# Patient Record
Sex: Male | Born: 1980 | Race: White | Hispanic: No | Smoking: Current every day smoker
Health system: Southern US, Community
[De-identification: ages and names within clinical notes are randomized; demographics above are authoritative.]

## PROBLEM LIST (undated history)

## (undated) DIAGNOSIS — K219 Gastro-esophageal reflux disease without esophagitis: Secondary | ICD-10-CM

## (undated) DIAGNOSIS — I10 Essential (primary) hypertension: Secondary | ICD-10-CM

## (undated) DIAGNOSIS — K311 Adult hypertrophic pyloric stenosis: Secondary | ICD-10-CM

## (undated) DIAGNOSIS — F419 Anxiety disorder, unspecified: Secondary | ICD-10-CM

## (undated) HISTORY — PX: OTHER SURGICAL HISTORY: SHX169

---

## 2008-07-13 ENCOUNTER — Emergency Department: Payer: Self-pay | Admitting: Emergency Medicine

## 2011-04-09 ENCOUNTER — Observation Stay: Payer: Self-pay | Admitting: Student

## 2015-11-13 ENCOUNTER — Encounter: Payer: Self-pay | Admitting: Emergency Medicine

## 2015-11-13 ENCOUNTER — Emergency Department
Admission: EM | Admit: 2015-11-13 | Discharge: 2015-11-13 | Disposition: A | Discharge status: Home / Self Care | Payer: Self-pay | Attending: Emergency Medicine | Admitting: Emergency Medicine

## 2015-11-13 DIAGNOSIS — F17210 Nicotine dependence, cigarettes, uncomplicated: Secondary | ICD-10-CM | POA: Insufficient documentation

## 2015-11-13 DIAGNOSIS — F1499 Cocaine use, unspecified with unspecified cocaine-induced disorder: Secondary | ICD-10-CM | POA: Insufficient documentation

## 2015-11-13 DIAGNOSIS — F1490 Cocaine use, unspecified, uncomplicated: Secondary | ICD-10-CM

## 2015-11-13 DIAGNOSIS — Z789 Other specified health status: Secondary | ICD-10-CM

## 2015-11-13 DIAGNOSIS — Z7289 Other problems related to lifestyle: Secondary | ICD-10-CM

## 2015-11-13 DIAGNOSIS — K920 Hematemesis: Secondary | ICD-10-CM | POA: Insufficient documentation

## 2015-11-13 DIAGNOSIS — F1099 Alcohol use, unspecified with unspecified alcohol-induced disorder: Secondary | ICD-10-CM | POA: Insufficient documentation

## 2015-11-13 LAB — CBC
HCT: 46.7 % (ref 40.0–52.0)
HEMOGLOBIN: 15.7 g/dL (ref 13.0–18.0)
MCH: 29.5 pg (ref 26.0–34.0)
MCHC: 33.7 g/dL (ref 32.0–36.0)
MCV: 87.6 fL (ref 80.0–100.0)
Platelets: 184 10*3/uL (ref 150–440)
RBC: 5.33 MIL/uL (ref 4.40–5.90)
RDW: 13.2 % (ref 11.5–14.5)
WBC: 9 10*3/uL (ref 3.8–10.6)

## 2015-11-13 LAB — COMPREHENSIVE METABOLIC PANEL
ALBUMIN: 4.6 g/dL (ref 3.5–5.0)
ALT: 29 U/L (ref 17–63)
ANION GAP: 9 (ref 5–15)
AST: 37 U/L (ref 15–41)
Alkaline Phosphatase: 53 U/L (ref 38–126)
BUN: 17 mg/dL (ref 6–20)
CO2: 28 mmol/L (ref 22–32)
Calcium: 10.3 mg/dL (ref 8.9–10.3)
Chloride: 101 mmol/L (ref 101–111)
Creatinine, Ser: 1.06 mg/dL (ref 0.61–1.24)
GFR calc Af Amer: >60 mL/min (ref >60)
GFR calc non Af Amer: >60 mL/min (ref >60)
GLUCOSE: 129 mg/dL — AB (ref 65–99)
POTASSIUM: 3.2 mmol/L — AB (ref 3.5–5.1)
SODIUM: 138 mmol/L (ref 135–145)
Total Bilirubin: 1 mg/dL (ref 0.3–1.2)
Total Protein: 7.6 g/dL (ref 6.5–8.1)

## 2015-11-13 LAB — LIPASE, BLOOD: Lipase: 18 U/L (ref 11–51)

## 2015-11-13 NOTE — ED Notes (Addendum)
Pt to ed with c/o vomiting since yesterday.  Pt reports it started out as clear liquid and then it turned to brown.  Pt states there was only one episode of vomiting.  Denies diarrhea. Pt states he ate a cheeseburger about 1 hour pta.  Denies vomiting since eating.

## 2015-11-13 NOTE — ED Provider Notes (Signed)
[facility] Emergency Department Provider Note  ____________________________________________  Time seen: Approximately 3:56 PM  I have reviewed the triage vital signs and the nursing notes.   HISTORY  Chief Complaint Hematemesis    HPI Jermaine Alvarez is a 35 y.o. male who is a daily alcohol drinker, cocaine user, presenting with hematemesis. The patient reports that he injected crack last night in 2 hours later he became nauseated and had 7 or 8 violent episodes of vomiting. The last several episodes had bright red blood in the vomit.He has had no further episodes of vomiting, denies any abdominal pain, lightheadedness, shortness of breath, or syncope. No black or tarry-colored looking stools. He has been eating normally today, including a cheeseburger at lunch.   History reviewed. No pertinent past medical history.  There are no active problems to display for this patient.   History reviewed. No pertinent past surgical history.  No current outpatient prescriptions on file.  Allergies Review of patient's allergies indicates no known allergies.  History reviewed. No pertinent family history.  Social History Social History  Substance Use Topics  . Smoking status: Current Every Day Smoker    Types: Cigarettes  . Smokeless tobacco: None  . Alcohol Use: 2.4 oz/week    4 Cans of beer per week    Review of Systems Constitutional: No fever/chills.No lightheadedness or syncope. Eyes: No visual changes. ENT: No sore throat. No congestion or rhinorrhea. Cardiovascular: Denies chest pain. Denies palpitations. Respiratory: Denies shortness of breath.  No cough. Gastrointestinal: No abdominal pain.  Positive nausea and vomiting with hematemesis.  No diarrhea.  No constipation. No melena or bright red blood per rectum. Genitourinary: Negative for dysuria. Musculoskeletal: Negative for back pain. Skin: Negative for rash. Neurological: Negative for  headaches. No focal numbness, tingling or weakness.   10-point ROS otherwise negative.  ____________________________________________   PHYSICAL EXAM:  VITAL SIGNS: ED Triage Vitals  Enc Vitals Group     BP 11/13/15 1256 159/89 mmHg     Pulse Rate 11/13/15 1256 68     Resp 11/13/15 1256 14     Temp 11/13/15 1256 98.4 F (36.9 C)     Temp Source 11/13/15 1256 Oral     SpO2 11/13/15 1256 100 %     Weight 11/13/15 1256 150 lb (68.04 kg)     Height 11/13/15 1256  (1.753 m)     Head Cir --      Peak Flow --      Pain Score 11/13/15 1259 0     Pain Loc --      Pain Edu? --      Excl. in GC? --     Constitutional: Alert and oriented. Well appearing and in no acute distress. Answers questions appropriately. Eyes: Conjunctivae are normal.  EOMI. No scleral icterus.No conjunctival pallor. Head: Atraumatic. Nose: No congestion/rhinnorhea. Mouth/Throat: Mucous membranes are moist.  Neck: No stridor.  Supple.  No JVD. No meningismus. Cardiovascular: Normal rate, regular rhythm. No murmurs, rubs or gallops.  Respiratory: Normal respiratory effort.  No accessory muscle use or retractions. Lungs CTAB.  No wheezes, rales or ronchi. GU: pt deferred Gastrointestinal: Soft, nontender and nondistended.  No guarding or rebound.  No peritoneal signs. Musculoskeletal: No LE edema. No ttp in the calves or palpable cords.  Negative Homan's sign. Neurologic:  A&Ox3.  Speech is clear.  Face and smile are symmetric.  EOMI.  Moves all extremities well. Skin:  Skin is warm, dry and intact. Multiple  track marks on his arms. No pallor. Psychiatric: Mood and affect are normal. Speech and behavior are normal.  Normal judgement.  ____________________________________________   LABS (all labs ordered are listed, but only abnormal results are displayed)  Labs Reviewed  COMPREHENSIVE METABOLIC PANEL - Abnormal; Notable for the following:    Potassium 3.2 (*)    Glucose, Bld 129 (*)    All other  components within normal limits  LIPASE, BLOOD  CBC  URINALYSIS COMPLETEWITH MICROSCOPIC ([facility] ONLY)   ____________________________________________  EKG  Not indicated ____________________________________________  RADIOLOGY  No results found.  ____________________________________________   PROCEDURES  Procedure(s) performed: None  Critical Care performed: No ____________________________________________   INITIAL IMPRESSION / ASSESSMENT AND PLAN / ED COURSE  Pertinent labs & imaging results that were available during my care of the patient were reviewed by me and considered in my medical decision making (see chart for details).  35 y.o. male with daily alcohol abuse and crack cocaine use presenting with multiple episodes of hematemesis last night, now resolved. The patient has not had any black or tarry-colored stools, and has no symptoms that would be concerning for significant anemia or dehydration or hypovolemia. The patient has stable vital signs today, and a normal H/H.  He has no epigastric pain, and I do not palpate anything in his abdomen that makes me concerned for acute intra-abdominal pathology. He has not been having pain, so GERD or peptic ulcer is less likely. It is possible that his hematemesis comes from esophageal irritation or gastric lining irritation with forceful vomiting. He does not have any clinical symptoms that would be concerning for esophageal tear. We have had a long discussion about NG tube and rectal examination for confirmation, and the patient defers this testing at this time. He understands that if he is bleeding, that the skin become a life-threatening condition and a very short period of time. He understands that we cannot say for certain that he is not bleeding if we do not do this testing. He understands return precautions and follow-up instructions, and prefers to be discharged home.  ____________________________________________  FINAL  CLINICAL IMPRESSION(S) / ED DIAGNOSES  Final diagnoses:  Alcohol use (HCC)  Hematemesis with nausea  Crack cocaine use      NEW MEDICATIONS STARTED DURING THIS VISIT:  New Prescriptions   No medications on file     Rockne MenghiniAnne-Caroline Jaycee Pelzer, MD 11/13/15 1603

## 2015-11-13 NOTE — Discharge Instructions (Signed)
Please stop using crack and cocaine, and either stop drinking or drink in moderation. If you are daily drinker, please talk to her primary care physician about stopping your alcohol use and do not quit cold [place].  Please return to the emergency department if you develop bleeding, lightheadedness or fainting, shortness of breath, black or tarry-colored looking stools, abdominal pain, or any other symptoms concerning to you.

## 2016-05-23 ENCOUNTER — Other Ambulatory Visit: Payer: Self-pay | Admitting: Nurse Practitioner

## 2016-05-23 DIAGNOSIS — K219 Gastro-esophageal reflux disease without esophagitis: Secondary | ICD-10-CM

## 2016-05-23 DIAGNOSIS — B1710 Acute hepatitis C without hepatic coma: Secondary | ICD-10-CM

## 2016-06-04 ENCOUNTER — Inpatient Hospital Stay: Admission: RE | Admit: 2016-06-04 | Payer: Self-pay | Source: Ambulatory Visit

## 2016-06-04 ENCOUNTER — Ambulatory Visit: Admission: RE | Admit: 2016-06-04 | Payer: Self-pay | Source: Ambulatory Visit

## 2016-06-17 ENCOUNTER — Ambulatory Visit
Admission: RE | Admit: 2016-06-17 | Discharge: 2016-06-17 | Disposition: A | Discharge status: Home / Self Care | Payer: Commercial Managed Care - PPO | Source: Ambulatory Visit | Attending: Nurse Practitioner | Admitting: Nurse Practitioner

## 2016-06-17 DIAGNOSIS — K449 Diaphragmatic hernia without obstruction or gangrene: Secondary | ICD-10-CM | POA: Insufficient documentation

## 2016-06-17 DIAGNOSIS — K219 Gastro-esophageal reflux disease without esophagitis: Secondary | ICD-10-CM | POA: Diagnosis not present

## 2016-06-17 DIAGNOSIS — B1710 Acute hepatitis C without hepatic coma: Secondary | ICD-10-CM

## 2017-08-27 ENCOUNTER — Other Ambulatory Visit: Payer: Self-pay | Admitting: Family Medicine

## 2017-08-27 DIAGNOSIS — E049 Nontoxic goiter, unspecified: Secondary | ICD-10-CM

## 2017-09-10 ENCOUNTER — Ambulatory Visit
Admission: RE | Admit: 2017-09-10 | Discharge: 2017-09-10 | Disposition: A | Discharge status: Home / Self Care | Payer: Commercial Managed Care - PPO | Source: Ambulatory Visit | Attending: Family Medicine | Admitting: Family Medicine

## 2017-09-10 DIAGNOSIS — E049 Nontoxic goiter, unspecified: Secondary | ICD-10-CM | POA: Diagnosis not present

## 2018-04-13 ENCOUNTER — Ambulatory Visit
Admission: EM | Admit: 2018-04-13 | Discharge: 2018-04-13 | Disposition: A | Discharge status: Home / Self Care | Payer: Commercial Managed Care - PPO | Attending: Family Medicine | Admitting: Family Medicine

## 2018-04-13 ENCOUNTER — Other Ambulatory Visit: Payer: Self-pay

## 2018-04-13 ENCOUNTER — Encounter: Payer: Self-pay | Admitting: Emergency Medicine

## 2018-04-13 DIAGNOSIS — B9789 Other viral agents as the cause of diseases classified elsewhere: Secondary | ICD-10-CM

## 2018-04-13 DIAGNOSIS — J0100 Acute maxillary sinusitis, unspecified: Secondary | ICD-10-CM | POA: Diagnosis not present

## 2018-04-13 DIAGNOSIS — J069 Acute upper respiratory infection, unspecified: Secondary | ICD-10-CM

## 2018-04-13 HISTORY — DX: Anxiety disorder, unspecified: F41.9

## 2018-04-13 HISTORY — DX: Essential (primary) hypertension: I10

## 2018-04-13 LAB — RAPID INFLUENZA A&B ANTIGENS (ARMC ONLY): INFLUENZA B (ARMC): NEGATIVE

## 2018-04-13 LAB — RAPID INFLUENZA A&B ANTIGENS: Influenza A (ARMC): NEGATIVE

## 2018-04-13 MED ORDER — DOXYCYCLINE HYCLATE 100 MG PO CAPS: 100.0000 mg | ORAL_CAPSULE | Freq: Two times a day (BID) | ORAL | 0 refills | Status: DC

## 2018-04-13 MED ORDER — BENZONATATE 100 MG PO CAPS: 100.0000 mg | ORAL_CAPSULE | Freq: Three times a day (TID) | ORAL | 0 refills | Status: DC | PRN

## 2018-04-13 NOTE — ED Provider Notes (Signed)
[facility] ____________________________________________  Time seen: Approximately 3:23 PM  I have reviewed the triage vital signs and the nursing notes.   HISTORY  Chief Complaint Cough  HPI Jermaine Alvarez is a 37 y.o. male presented for evaluation of 1 week of runny nose, nasal congestion and cough.  Reports some intermittent sore throat.  States symptoms started off like a cold, but have continued.  Denies fevers at first.  Reports for the last 2 days he has been having intermittent low-grade fevers, T-max 99.1.  Did take some over-the-counter ibuprofen, last taken last night.  Occasional Mucinex use.  Denies other over-the-counter medications use for the same complaints.  Has continued to remain active.  Continues to overall eat and drink well.  States cough is day and nighttime, sometimes dry, sometimes productive of greenish-yellowish mucus.  Also thick yellowish mucus with blowing nose.  Intermittent sinus pressure on his face described as mild. Denies chest pain, shortness of breath, abdominal pain, or rash. Denies recent sickness. Denies recent antibiotic use.  Denies known direct sick contacts.  Jerl Mina, MD: PCP   Past Medical History:  Diagnosis Date  . Anxiety   . Hypertension     There are no active problems to display for this patient.   History reviewed. No pertinent surgical history.   No current facility-administered medications for this encounter.   Current Outpatient Medications:  .  escitalopram (LEXAPRO) 10 MG tablet, Take 10 mg by mouth daily., Disp: , Rfl: 2 .  lisinopril (PRINIVIL,ZESTRIL) 20 MG tablet, Take by mouth., Disp: , Rfl:  .  omeprazole (PRILOSEC) 20 MG capsule, Take by mouth., Disp: , Rfl:  .  benzonatate (TESSALON PERLES) 100 MG capsule, Take 1 capsule (100 mg total) by mouth 3 (three) times daily as needed for cough., Disp: 15 capsule, Rfl: 0 .  doxycycline (VIBRAMYCIN) 100 MG capsule, Take 1 capsule (100 mg total) by  mouth 2 (two) times daily., Disp: 20 capsule, Rfl: 0  Allergies Patient has no known allergies.  History reviewed. No pertinent family history.  Social History Social History   Tobacco Use  . Smoking status: Current Every Day Smoker    Packs/day: 1.00    Types: Cigarettes  . Smokeless tobacco: Never Used  Substance Use Topics  . Alcohol use: Yes    Alcohol/week: 4.0 standard drinks    Types: 4 Cans of beer per week    Comment: 1-2 beers daily  . Drug use: Not Currently    Types: Cocaine, Marijuana    Comment: pain pills, "whatever I can get"      Review of Systems Constitutional: possible fevers.  ENT: as above.  Cardiovascular: Denies chest pain. Respiratory: Denies shortness of breath. Gastrointestinal: No abdominal pain.  No nausea, no vomiting.  No diarrhea.   Genitourinary: Negative for dysuria. Musculoskeletal: Negative for back pain. Skin: Negative for rash.  ____________________________________________   PHYSICAL EXAM:  VITAL SIGNS: ED Triage Vitals  Enc Vitals Group     BP 04/13/18 1503 (!) 142/86     Pulse Rate 04/13/18 1503 (!) 102     Resp 04/13/18 1503 17     Temp 04/13/18 1503 99 F (37.2 C)     Temp Source 04/13/18 1503 Oral     SpO2 04/13/18 1503 99 %     Weight 04/13/18 1504 155 lb (70.3 kg)     Height 04/13/18 1504 5\' 10"  (1.778 m)     Head Circumference --      Peak  Flow --      Pain Score 04/13/18 1504 2     Pain Loc --      Pain Edu? --      Excl. in GC? --    Constitutional: Alert and oriented. Well appearing and in no acute distress. Eyes: Conjunctivae are normal.  Head: Atraumatic.Minimal tenderness to palpation bilateral maxillary sinuses.No frontal sinus tenderness.  No swelling. No erythema.   Ears: no erythema, normal TMs bilaterally.   Nose: nasal congestion with bilateral nasal turbinate erythema and edema.   Mouth/Throat: Mucous membranes are moist.  Oropharynx non-erythematous.No tonsillar swelling or exudate.  Neck: No  stridor.  No cervical spine tenderness to palpation. Hematological/Lymphatic/Immunilogical: No cervical lymphadenopathy. Cardiovascular: Normal rate, regular rhythm. Grossly normal heart sounds.  Good peripheral circulation. Respiratory: Normal respiratory effort.  No retractions.No wheezes, rales or rhonchi. Good air movement.  Dry intermittent cough in room. Musculoskeletal: No cervical, thoracic or lumbar tenderness to palpation.  Neurologic:  Normal speech and language.No gait instability. Skin:  Skin is warm, dry and intact. No rash noted. Psychiatric: Mood and affect are normal. Speech and behavior are normal.  ___________________________________________   LABS (all labs ordered are listed, but only abnormal results are displayed)  Labs Reviewed  RAPID INFLUENZA A&B ANTIGENS ([facility] ONLY)     PROCEDURES Procedures   INITIAL IMPRESSION / ASSESSMENT AND PLAN / ED COURSE  Pertinent labs & imaging results that were available during my care of the patient were reviewed by me and considered in my medical decision making (see chart for details).  Well-appearing patient.  No acute distress.  Suspect recent viral upper respiratory infection, with secondary sinusitis and bronchitis.  Will treat with oral doxycycline, PRN Tessalon Perles and discussed continue over-the-counter Mucinex.  Encourage rest, fluids, supportive care and PRN ibuprofen and Tylenol as needed for fever control.  Work note given for today and tomorrow.Discussed indication, risks and benefits of medications with patient.  Discussed follow up with Primary care physician this week. Discussed follow up and return parameters including no resolution or any worsening concerns. Patient verbalized understanding and agreed to plan.   ____________________________________________   FINAL CLINICAL IMPRESSION(S) / ED DIAGNOSES  Final diagnoses:  Viral URI with cough  Acute maxillary sinusitis, recurrence not specified      ED Discharge Orders         Ordered    doxycycline (VIBRAMYCIN) 100 MG capsule  2 times daily     04/13/18 1518    benzonatate (TESSALON PERLES) 100 MG capsule  3 times daily PRN     04/13/18 1518           Note: This dictation was prepared with Dragon dictation along with smaller phrase technology. Any transcriptional errors that result from this process are unintentional.         Renford Dills, NP 04/13/18 1530

## 2018-04-13 NOTE — Discharge Instructions (Addendum)
Take medication as prescribed. Rest. Drink plenty of fluids.  ° °Follow up with your primary care physician this week as needed. Return to Urgent care for new or worsening concerns.  ° °

## 2018-04-13 NOTE — ED Triage Notes (Signed)
Pt c/o cough, congestion, and low grade fever. He had congestion for about a week but worsened yesterday.

## 2019-12-01 ENCOUNTER — Emergency Department
Admission: EM | Admit: 2019-12-01 | Discharge: 2019-12-01 | Disposition: A | Discharge status: Home / Self Care | Payer: Self-pay | Attending: Student | Admitting: Student

## 2019-12-01 ENCOUNTER — Encounter: Payer: Self-pay | Admitting: Emergency Medicine

## 2019-12-01 ENCOUNTER — Other Ambulatory Visit: Payer: Self-pay

## 2019-12-01 ENCOUNTER — Emergency Department: Payer: Self-pay

## 2019-12-01 DIAGNOSIS — M545 Low back pain, unspecified: Secondary | ICD-10-CM

## 2019-12-01 DIAGNOSIS — I10 Essential (primary) hypertension: Secondary | ICD-10-CM | POA: Insufficient documentation

## 2019-12-01 DIAGNOSIS — Z79899 Other long term (current) drug therapy: Secondary | ICD-10-CM | POA: Insufficient documentation

## 2019-12-01 DIAGNOSIS — F17210 Nicotine dependence, cigarettes, uncomplicated: Secondary | ICD-10-CM | POA: Insufficient documentation

## 2019-12-01 LAB — URINALYSIS, COMPLETE (UACMP) WITH MICROSCOPIC
Bacteria, UA: NONE SEEN
Bilirubin Urine: NEGATIVE
Glucose, UA: NEGATIVE mg/dL
Ketones, ur: NEGATIVE mg/dL
Leukocytes,Ua: NEGATIVE
Nitrite: NEGATIVE
Protein, ur: NEGATIVE mg/dL
Specific Gravity, Urine: 1.016 (ref 1.005–1.030)
Squamous Epithelial / LPF: NONE SEEN (ref 0–5)
pH: 8 (ref 5.0–8.0)

## 2019-12-01 MED ORDER — NAPROXEN 500 MG PO TABS: 500.0000 mg | ORAL_TABLET | Freq: Two times a day (BID) | ORAL | 0 refills | Status: DC

## 2019-12-01 MED ORDER — KETOROLAC TROMETHAMINE 30 MG/ML IJ SOLN
30.0000 mg | Freq: Once | INTRAMUSCULAR | Status: AC
  Administered 2019-12-01: 30 mg via INTRAMUSCULAR
  Filled 2019-12-01: qty 1

## 2019-12-01 MED ORDER — CYCLOBENZAPRINE HCL 5 MG PO TABS: 5.0000 mg | ORAL_TABLET | Freq: Every day | ORAL | 0 refills | Status: DC

## 2019-12-01 NOTE — ED Provider Notes (Signed)
[facility] Emergency Department Provider Note   ____________________________________________   First MD Initiated Contact with Patient 12/01/19 1222     (approximate)  I have reviewed the triage vital signs and the nursing notes.   HISTORY  Chief Complaint Back Pain   HPI Jermaine Alvarez is a 39 y.o. male presents to the ED with complaint of mid back pain.  Patient states that this is worse with movement and started on his right side and has moved to his left.  He states that last evening when he was getting in bed he had a difficult time getting comfortable.  He began having back pain "years ago" and has gradually gotten worse over the years until the last month it has become worse.  Patient denies any nausea, vomiting, urinary symptoms or history of kidney stones.  Patient denies any known injury to his back.  He has taken some over-the-counter medication without any relief of his symptoms.  He denies any paresthesias or incontinence of bowel or bladder.  Patient still able to ambulate without assistance.  He rates pain as 7 out of 10.       Past Medical History:  Diagnosis Date  . Anxiety   . Hypertension     There are no problems to display for this patient.   History reviewed. No pertinent surgical history.  Prior to Admission medications   Medication Sig Start Date End Date Taking? Authorizing Provider  cyclobenzaprine (FLEXERIL) 5 MG tablet Take 1 tablet (5 mg total) by mouth at bedtime. 12/01/19   Tommi Rumps, PA-C  escitalopram (LEXAPRO) 10 MG tablet Take 10 mg by mouth daily. 03/21/18   [provider]  lisinopril (PRINIVIL,ZESTRIL) 20 MG tablet Take by mouth. 11/06/17   [provider]  naproxen (NAPROSYN) 500 MG tablet Take 1 tablet (500 mg total) by mouth 2 (two) times daily with a meal. 12/01/19   Tommi Rumps, PA-C  omeprazole (PRILOSEC) 20 MG capsule Take by mouth. 08/01/16   [provider]     Allergies Patient has no known allergies.  History reviewed. No pertinent family history.  Social History Social History   Tobacco Use  . Smoking status: Current Every Day Smoker    Packs/day: 1.00    Types: Cigarettes  . Smokeless tobacco: Never Used  Vaping Use  . Vaping Use: Never used  Substance Use Topics  . Alcohol use: Yes    Alcohol/week: 4.0 standard drinks    Types: 4 Cans of beer per week    Comment: 1-2 beers daily  . Drug use: Not Currently    Types: Cocaine, Marijuana    Comment: pain pills, "whatever I can get"      Review of Systems Constitutional: No fever/chills Eyes: No visual changes. Cardiovascular: Denies chest pain. Respiratory: Denies shortness of breath. Gastrointestinal: No abdominal pain.  No nausea, no vomiting.  Genitourinary: Negative for dysuria. Musculoskeletal: Positive for back pain. Skin: Negative for rash. Neurological: Negative for headaches, focal weakness or numbness. ____________________________________________   PHYSICAL EXAM:  VITAL SIGNS: ED Triage Vitals [12/01/19 1155]  Enc Vitals Group     BP (!) 140/95     Pulse Rate 96     Resp 20     Temp 98.2 F (36.8 C)     Temp Source Oral     SpO2 98 %     Weight 180 lb (81.6 kg)     Height 5\' 9"  (1.753 m)  Head Circumference      Peak Flow      Pain Score 7     Pain Loc      Pain Edu?      Excl. in [place]?     Constitutional: Alert and oriented. Well appearing and in no acute distress. Eyes: Conjunctivae are normal.  Head: Atraumatic. Neck: No stridor.   Cardiovascular: Normal rate, regular rhythm. Grossly normal heart sounds.  Good peripheral circulation. Respiratory: Normal respiratory effort.  No retractions. Lungs CTAB. Gastrointestinal: Soft and nontender. No distention.  Bowel sounds are normoactive x4 quadrants. Musculoskeletal: There is some mild tenderness on palpation of the lumbar spine and paravertebral muscles bilaterally.  There is also noted  tenderness to bilateral ribs to palpation laterally which increases with range of motion.  Patient is restricted with range of motion secondary to pain.  Good muscle strength bilaterally.   Neurologic:  Normal speech and language.  Reflexes are equal bilaterally.  No gross focal neurologic deficits are appreciated.  Skin:  Skin is warm, dry and intact. No rash noted. Psychiatric: Mood and affect are normal. Speech and behavior are normal.  ____________________________________________   LABS (all labs ordered are listed, but only abnormal results are displayed)  Labs Reviewed  URINALYSIS, COMPLETE (UACMP) WITH MICROSCOPIC - Abnormal; Notable for the following components:      Result Value   Color, Urine YELLOW (*)    APPearance CLEAR (*)    Hgb urine dipstick SMALL (*)    All other components within normal limits   RADIOLOGY   Official radiology report(s): DG Thoracic Spine 2 View  Result Date: 12/01/2019 CLINICAL DATA:  Mid to upper right-sided back pain. EXAM: THORACIC SPINE 2 VIEWS COMPARISON:  None. FINDINGS: There is no evidence of thoracic spine fracture. Alignment is normal. No other significant bone abnormalities are identified. IMPRESSION: Negative. Electronically Signed   By: Virgina Norfolk M.D.   On: 12/01/2019 15:38   DG Lumbar Spine 2-3 Views  Result Date: 12/01/2019 CLINICAL DATA:  39 year old male with back pain.  No known injury. EXAM: LUMBAR SPINE - 2-3 VIEW COMPARISON:  None. FINDINGS: Five lumbar type vertebra. There is no acute fracture or subluxation of the lumbar spine. The vertebral body heights and disc spaces are maintained. The visualized posterior elements appear intact. The neural foramina are patent. The soft tissues are unremarkable. IMPRESSION: Negative. Electronically Signed   By: Anner Crete M.D.   On: 12/01/2019 15:37    ____________________________________________   PROCEDURES  Procedure(s) performed (including Critical  Care):  Procedures   ____________________________________________   INITIAL IMPRESSION / ASSESSMENT AND PLAN / ED COURSE  As part of my medical decision making, I reviewed the following data within the electronic MEDICAL RECORD NUMBER Notes from prior ED visits and [place] Controlled Substance Database  39 year old male presents to the ED with complaint of back pain without history of injury.  Patient states that pain started today.  Patient states that it began on the right side and moved over to the left side making it difficult for him to move.  Movement increases his pain.  He denies any urinary symptoms or history of stones.  Urinalysis was negative and lumbar spine was negative and reassuring for the patient.  Patient states that he was getting relief from his pain after being given Toradol 30 mg IM prior to his x-rays.  A prescription for Flexeril 5 mg 1 at bedtime for the next 5 days and Naprosyn 500 mg twice daily with  food.  Patient is to follow-up with his PCP if any continued problems.  He is aware that he can use ice or heat to his back as needed for discomfort.  ____________________________________________   FINAL CLINICAL IMPRESSION(S) / ED DIAGNOSES  Final diagnoses:  Back pain, lumbosacral     ED Discharge Orders         Ordered    cyclobenzaprine (FLEXERIL) 5 MG tablet  Daily at bedtime     Discontinue  Reprint     12/01/19 1553    naproxen (NAPROSYN) 500 MG tablet  2 times daily with meals     Discontinue  Reprint     12/01/19 1553           Note:  This document was prepared using Dragon voice recognition software and may include unintentional dictation errors.    Tommi Rumps, PA-C 12/01/19 1603    Miguel Aschoff., MD 12/02/19 2014

## 2019-12-01 NOTE — ED Triage Notes (Signed)
Pt presents to ED via POV with c/o mid back pain at this time. Pt states pain worse with movement. Pt states pain started on R side and moved to L side. Pt states pain worse with movement/turning. Pt also c/o nasal congestion and cough at this time, pt states has been going on for months.

## 2019-12-01 NOTE — Discharge Instructions (Signed)
Follow-up with your primary care provider if any continued problems or concerns.  You may use ice or heat to your back as needed for discomfort.  Begin taking medication with Flexeril being at bedtime only.  Do not drive while taking this medication as it could cause drowsiness and increase your risk for injury.  The naproxen is with food twice a day and will help with inflammation and pain.  X-rays and urinalysis were negative which is reassuring.

## 2020-04-03 ENCOUNTER — Other Ambulatory Visit: Payer: Self-pay

## 2020-04-03 ENCOUNTER — Encounter: Payer: Self-pay | Admitting: Internal Medicine

## 2020-04-03 ENCOUNTER — Ambulatory Visit (INDEPENDENT_AMBULATORY_CARE_PROVIDER_SITE_OTHER): Payer: Self-pay

## 2020-04-03 ENCOUNTER — Ambulatory Visit
Admission: EM | Admit: 2020-04-03 | Discharge: 2020-04-03 | Disposition: A | Discharge status: Home / Self Care | Payer: Self-pay | Attending: Family Medicine | Admitting: Family Medicine

## 2020-04-03 DIAGNOSIS — Y929 Unspecified place or not applicable: Secondary | ICD-10-CM | POA: Insufficient documentation

## 2020-04-03 DIAGNOSIS — Y939 Activity, unspecified: Secondary | ICD-10-CM | POA: Insufficient documentation

## 2020-04-03 DIAGNOSIS — R109 Unspecified abdominal pain: Secondary | ICD-10-CM | POA: Insufficient documentation

## 2020-04-03 DIAGNOSIS — F17210 Nicotine dependence, cigarettes, uncomplicated: Secondary | ICD-10-CM | POA: Insufficient documentation

## 2020-04-03 DIAGNOSIS — Z20822 Contact with and (suspected) exposure to covid-19: Secondary | ICD-10-CM | POA: Insufficient documentation

## 2020-04-03 DIAGNOSIS — J069 Acute upper respiratory infection, unspecified: Secondary | ICD-10-CM

## 2020-04-03 DIAGNOSIS — R3129 Other microscopic hematuria: Secondary | ICD-10-CM

## 2020-04-03 DIAGNOSIS — S2249XA Multiple fractures of ribs, unspecified side, initial encounter for closed fracture: Secondary | ICD-10-CM

## 2020-04-03 DIAGNOSIS — R079 Chest pain, unspecified: Secondary | ICD-10-CM

## 2020-04-03 DIAGNOSIS — M545 Low back pain, unspecified: Secondary | ICD-10-CM | POA: Insufficient documentation

## 2020-04-03 DIAGNOSIS — Y999 Unspecified external cause status: Secondary | ICD-10-CM | POA: Insufficient documentation

## 2020-04-03 DIAGNOSIS — R059 Cough, unspecified: Secondary | ICD-10-CM | POA: Insufficient documentation

## 2020-04-03 DIAGNOSIS — R0981 Nasal congestion: Secondary | ICD-10-CM | POA: Insufficient documentation

## 2020-04-03 HISTORY — DX: Adult hypertrophic pyloric stenosis: K31.1

## 2020-04-03 LAB — URINALYSIS, COMPLETE (UACMP) WITH MICROSCOPIC
Bilirubin Urine: NEGATIVE
Glucose, UA: NEGATIVE mg/dL
Leukocytes,Ua: NEGATIVE
Nitrite: NEGATIVE
Specific Gravity, Urine: 1.02 (ref 1.005–1.030)
pH: 7 (ref 5.0–8.0)

## 2020-04-03 LAB — SARS CORONAVIRUS 2 (TAT 6-24 HRS): SARS Coronavirus 2: NEGATIVE

## 2020-04-03 MED ORDER — LIDOCAINE 5 % EX PTCH: 1.0000 | MEDICATED_PATCH | CUTANEOUS | 0 refills | Status: DC

## 2020-04-03 MED ORDER — TRAMADOL HCL 50 MG PO TABS: 50.0000 mg | ORAL_TABLET | Freq: Four times a day (QID) | ORAL | 0 refills | Status: DC | PRN

## 2020-04-03 NOTE — ED Provider Notes (Signed)
[facility]    CSN: [id number] Arrival date & time: 04/03/20  1215      History   Chief Complaint Chief Complaint  Patient presents with  . Flank Pain  . Cough  . Nasal Congestion    HPI Jermaine Alvarez is a 39 y.o. male who presents with 2 complaints 1- R flank pain which started 3 days ago and radiates to his abdomen. Also been having urinary frequency. He has never had renal stones. His mother has had them. Coughing provokes his L back pain. He called and mentioned to the xray tech that he reached over his truck window 10 days ago and hit his R rib area, but was not in severe pain.   2- Has had nose and chest congestion x 3 days. Has felt feverish, but did not take his temp. Has been taking Mucinex since yesterday. Had similar pain in June and had thoracic and lumbar xrays which were neg. And his pain resolved, and come back worse 3 days ago. Has similar pain on the L area in the past.    Past Medical History:  Diagnosis Date  . Anxiety   . Hypertension   . Pyloric stenosis     There are no problems to display for this patient.   Past Surgical History:  Procedure Laterality Date  . pyloric stenosis       Home Medications    Prior to Admission medications   Medication Sig Start Date End Date Taking? Authorizing Provider  escitalopram (LEXAPRO) 10 MG tablet Take 10 mg by mouth daily. 03/21/18  Yes [provider]  lisinopril (PRINIVIL,ZESTRIL) 20 MG tablet Take by mouth. 11/06/17  Yes [provider]  naproxen (NAPROSYN) 500 MG tablet Take 1 tablet (500 mg total) by mouth 2 (two) times daily with a meal. 12/01/19  Yes Tommi Rumps, PA-C  omeprazole (PRILOSEC) 20 MG capsule Take by mouth. 08/01/16  Yes [provider]  cyclobenzaprine (FLEXERIL) 5 MG tablet Take 1 tablet (5 mg total) by mouth at bedtime. 12/01/19   Tommi Rumps, PA-C  lidocaine (LIDODERM) 5 % Place 1 patch onto the skin daily. Remove & Discard patch  within 12 hours or as directed by MD 04/03/20   Rodriguez-Southworth, Nettie Elm, PA-C  traMADol (ULTRAM) 50 MG tablet Take 1 tablet (50 mg total) by mouth every 6 (six) hours as needed for severe pain. 04/03/20   Rodriguez-Southworth, Nettie Elm, PA-C    Family History Family History  Problem Relation Age of Onset  . Other Mother        unknown medical history  . Heart attack Father 89    Social History Social History   Tobacco Use  . Smoking status: Current Every Day Smoker    Packs/day: 1.00    Years: 20.00    Pack years: 20.00    Types: Cigarettes  . Smokeless tobacco: Never Used  Vaping Use  . Vaping Use: Never used  Substance Use Topics  . Alcohol use: Yes    Alcohol/week: 14.0 standard drinks    Types: 14 Cans of beer per week    Comment: 6 beers daily  . Drug use: Not Currently    Types: Cocaine, Marijuana    Comment: pain pills, "whatever I can get"  04/03/20- last use ~3 months ago      Allergies   Patient has no known allergies.   Review of Systems Review of Systems  Constitutional: Positive for chills, diaphoresis and fever. Negative for  appetite change and fatigue.  HENT: Positive for congestion, postnasal drip and rhinorrhea. Negative for ear discharge, ear pain, sore throat and trouble swallowing.   Eyes: Negative for discharge.  Respiratory: Positive for cough. Negative for chest tightness, shortness of breath and wheezing.   Cardiovascular: Negative for chest pain.  Gastrointestinal: Positive for diarrhea. Negative for abdominal distention, abdominal pain, blood in stool, nausea and vomiting.  Genitourinary: Positive for flank pain and urgency. Negative for dysuria and hematuria.  Musculoskeletal: Negative for back pain, gait problem and myalgias.  Skin: Negative for rash.  Neurological: Negative for weakness and headaches.  Hematological: Negative for adenopathy.   Physical Exam Triage Vital Signs ED Triage Vitals  Enc Vitals Group     BP 04/03/20  1246 117/76     Pulse Rate 04/03/20 1246 96     Resp 04/03/20 1246 18     Temp 04/03/20 1246 98.2 F (36.8 C)     Temp Source 04/03/20 1246 Oral     SpO2 04/03/20 1246 96 %     Weight 04/03/20 1247 185 lb (83.9 kg)     Height 04/03/20 1247 5\' 9"  (1.753 m)     Head Circumference --      Peak Flow --      Pain Score 04/03/20 1245 3     Pain Loc --      Pain Edu? --      Excl. in GC? --    No data found.  Updated Vital Signs BP 117/76 (BP Location: Left Arm)   Pulse 96   Temp 98.2 F (36.8 C) (Oral)   Resp 18   Ht 5\' 9"  (1.753 m)   Wt 185 lb (83.9 kg)   SpO2 96%   BMI 27.32 kg/m   Visual Acuity Right Eye Distance:   Left Eye Distance:   Bilateral Distance:    Right Eye Near:   Left Eye Near:    Bilateral Near:     Physical Exam Constitutional:      General: He is not in acute distress.    Appearance: He is not toxic-appearing.  HENT:     Head: Normocephalic.     Right Ear: Tympanic membrane, ear canal and external ear normal.     Left Ear: Tympanic membrane, ear canal and external ear normal.     Nose: Nose normal.     Mouth/Throat:     Pharynx: Oropharynx is clear.  Eyes:     General: No scleral icterus.    Conjunctiva/sclera: Conjunctivae normal.     Pupils: Pupils are equal, round, and reactive to light.  Cardiovascular:     Rate and Rhythm: Normal rate and regular rhythm.  Pulmonary:     Effort: Pulmonary effort is normal.     Breath sounds: Normal breath sounds.     Comments: R lower ribs posteriorly and laterally are tender, and worse with guarding of his lateral region. Denies hyperalgesia with light touch. Has crepitations present on my inial exam.  Chest:     Chest wall: Tenderness present.  Abdominal:     General: Bowel sounds are normal.     Palpations: Abdomen is soft. There is no mass.     Tenderness: There is no abdominal tenderness. There is right CVA tenderness. There is no left CVA tenderness, guarding or rebound.     Hernia: No hernia  is present.     Comments: Has mild R CVA tednerness  Musculoskeletal:  General: Normal range of motion.     Cervical back: Neck supple.  Skin:    General: Skin is warm and dry.     Findings: No bruising or rash.  Neurological:     Mental Status: He is alert and oriented to person, place, and time.     Gait: Gait normal.  Psychiatric:        Mood and Affect: Mood normal.        Behavior: Behavior normal.        Thought Content: Thought content normal.        Judgment: Judgment normal.     UC Treatments / Results  Labs (all labs ordered are listed, but only abnormal results are displayed) Labs Reviewed  URINALYSIS, COMPLETE (UACMP) WITH MICROSCOPIC - Abnormal; Notable for the following components:      Result Value   Hgb urine dipstick MODERATE (*)    Ketones, ur TRACE (*)    Protein, ur TRACE (*)    Bacteria, UA FEW (*)    All other components within normal limits  SARS CORONAVIRUS 2 (TAT 6-24 HRS)  URINE CULTURE    EKG   Radiology DG Ribs Unilateral W/Chest Right  Result Date: 04/03/2020 CLINICAL DATA:  Right chest and flank pain for approximately 3 days. The patient felt some pain in the right chest when he leaned into a truck window 10 days ago. Initial encounter. EXAM: RIGHT RIBS AND CHEST - 3+ VIEW COMPARISON:  None. FINDINGS: The lungs are clear. Heart size is normal. No pneumothorax or pleural effusion. There is an acute right ninth rib fracture. The fracture is minimally displaced. IMPRESSION: Acute right ninth rib fracture. Negative for pneumothorax or other acute cardiopulmonary disease. Electronically Signed   By: Drusilla Kanner M.D.   On: 04/03/2020 14:13   DG Abdomen 1 View  Result Date: 04/03/2020 CLINICAL DATA:  Right chest and flank pain for approximately 3 days. The patient felt some pain in the right chest when he leaned into a truck window 10 days ago. Initial encounter. EXAM: ABDOMEN - 1 VIEW COMPARISON:  None. FINDINGS: The bowel gas pattern  is normal. No radio-opaque calculi or other significant radiographic abnormality are seen in the abdomen. Right ninth rib fracture is seen on dedicated plain films of the ribs noted. IMPRESSION: Right ninth rib fracture.  Otherwise negative. Electronically Signed   By: Drusilla Kanner M.D.   On: 04/03/2020 14:14    Procedures Procedures (including critical care time)  Medications Ordered in UC Medications - No data to display  Initial Impression / Assessment and Plan / UC Course  I have reviewed the triage vital signs and the nursing notes. Has microscopic hematuria of unknown cause. I sent it for a culture. KUB was negative, but I reviewed S&S of renal stones and needs to be seen if this happens.  I placed him on Tramadol and Lidoderm patches for his rib pain. Needs to FU with PCP. We will call him with urine culture results.  See instructions Pertinent labs & imaging results that were available during my care of the patient were reviewed by me and considered in my medical decision making (see chart for details).  Final Clinical Impressions(s) / UC Diagnoses   Final diagnoses:  Microscopic hematuria  Closed fracture of five ribs, unspecified laterality, initial encounter     Discharge Instructions     Follow up with your primary care doctor in one week I will send your urine for a culture to  check for infection. We will let you know if we need to treat you if it is positive.     ED Prescriptions    Medication Sig Dispense Auth. Provider   traMADol (ULTRAM) 50 MG tablet Take 1 tablet (50 mg total) by mouth every 6 (six) hours as needed for severe pain. 15 tablet Rodriguez-Southworth, Cathalina Barcia, PA-C   lidocaine (LIDODERM) 5 % Place 1 patch onto the skin daily. Remove & Discard patch within 12 hours or as directed by MD 30 patch Rodriguez-Southworth, Nettie ElmSylvia, PA-C     I have reviewed the PDMP during this encounter.   Garey HamRodriguez-Southworth, Ryson Bacha, [place]-C 04/03/20 1530

## 2020-04-03 NOTE — Discharge Instructions (Signed)
Follow up with your primary care doctor in one week I will send your urine for a culture to check for infection. We will let you know if we need to treat you if it is positive.

## 2020-04-03 NOTE — ED Triage Notes (Signed)
Patient in today c/o right flank pain that radiates to his abdomen x 3 days. Patient states he is having some urinary frequency.  Patient also c/o cough, nasal and chest congestion x 3 days. Patient states he has felt feverish, but hasn't taken his temperature. Patient has taken OTC Mucinex yesterday.

## 2020-04-05 LAB — URINE CULTURE
Culture: <10000 — AB
Special Requests: NORMAL

## 2020-04-07 ENCOUNTER — Other Ambulatory Visit: Payer: Self-pay

## 2020-04-07 ENCOUNTER — Encounter: Payer: Self-pay | Admitting: Emergency Medicine

## 2020-04-07 ENCOUNTER — Ambulatory Visit
Admission: EM | Admit: 2020-04-07 | Discharge: 2020-04-07 | Disposition: A | Discharge status: Home / Self Care | Payer: Self-pay | Attending: Internal Medicine | Admitting: Internal Medicine

## 2020-04-07 DIAGNOSIS — Z23 Encounter for immunization: Secondary | ICD-10-CM

## 2020-04-07 DIAGNOSIS — M722 Plantar fascial fibromatosis: Secondary | ICD-10-CM

## 2020-04-07 DIAGNOSIS — S01112A Laceration without foreign body of left eyelid and periocular area, initial encounter: Secondary | ICD-10-CM

## 2020-04-07 MED ORDER — TETANUS-DIPHTH-ACELL PERTUSSIS 5-2.5-18.5 LF-MCG/0.5 IM SUSY
0.5000 mL | PREFILLED_SYRINGE | Freq: Once | INTRAMUSCULAR | Status: AC
  Administered 2020-04-07: 0.5 mL via INTRAMUSCULAR
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
## 2020-04-07 NOTE — Discharge Instructions (Signed)
Ice area of injury for 10 minutes 3-4 times the next 48 hours. Apply a cloth other the ice pack. Do stretches as I taught you, if you dont get better, you need to see a podiatrist.
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
## 2020-07-26 ENCOUNTER — Encounter: Payer: Self-pay | Admitting: Emergency Medicine

## 2020-07-26 ENCOUNTER — Ambulatory Visit
Admission: EM | Admit: 2020-07-26 | Discharge: 2020-07-26 | Disposition: A | Discharge status: Home / Self Care | Payer: Self-pay | Attending: Emergency Medicine | Admitting: Emergency Medicine

## 2020-07-26 ENCOUNTER — Other Ambulatory Visit: Payer: Self-pay

## 2020-07-26 DIAGNOSIS — B372 Candidiasis of skin and nail: Secondary | ICD-10-CM

## 2020-07-26 DIAGNOSIS — H6981 Other specified disorders of Eustachian tube, right ear: Secondary | ICD-10-CM

## 2020-07-26 MED ORDER — METHYLPREDNISOLONE 4 MG PO TBPK: ORAL_TABLET | ORAL | 0 refills | Status: DC

## 2020-07-26 NOTE — ED Provider Notes (Signed)
[facility]    CSN: [id number] Arrival date & time: 07/26/20  0919      History   Chief Complaint Chief Complaint  Patient presents with  . Ear Pain  . Rash    HPI Jermaine Alvarez is a 40 y.o. male.   HPI   40 year old male here for evaluation of right ear fullness.  Patient reports that he had symptoms off and on for the past month.  Been unable to clear his ear and his hearing has been muffled.  He also reports that he has had itching in that ear.  Patient states that he has nasal congestion and runny nose all the time as he has bad allergies.  For which she uses Flonase intermittently.  Patient denies fever or drainage from the ear.  Having a rash that is on his chest and neck looked at.  The rash has been there for years and itches from time to time.  Past Medical History:  Diagnosis Date  . Anxiety   . Hypertension   . Pyloric stenosis     There are no problems to display for this patient.   Past Surgical History:  Procedure Laterality Date  . pyloric stenosis         Home Medications    Prior to Admission medications   Medication Sig Start Date End Date Taking? Authorizing Provider  escitalopram (LEXAPRO) 10 MG tablet Take 10 mg by mouth daily. 03/21/18  Yes [provider]  lisinopril (PRINIVIL,ZESTRIL) 20 MG tablet Take by mouth. 11/06/17  Yes [provider]  methylPREDNISolone (MEDROL DOSEPAK) 4 MG TBPK tablet Take according to the package insert. 07/26/20  Yes Margarette Canada, NP  omeprazole (PRILOSEC) 20 MG capsule Take by mouth. 08/01/16  Yes [provider]    Family History Family History  Problem Relation Age of Onset  . Other Mother        unknown medical history  . Heart attack Father 75    Social History Social History   Tobacco Use  . Smoking status: Current Every Day Smoker    Packs/day: 1.00    Years: 20.00    Pack years: 20.00    Types: Cigarettes  . Smokeless tobacco: Never Used   Vaping Use  . Vaping Use: Never used  Substance Use Topics  . Alcohol use: Yes    Alcohol/week: 14.0 standard drinks    Types: 14 Cans of beer per week  . Drug use: Not Currently    Types: Cocaine, Marijuana    Comment: pain pills, "whatever I can get"  04/03/20- last use ~3 months ago      Allergies   Patient has no known allergies.   Review of Systems Review of Systems  Constitutional: Negative for fever.  HENT: Positive for ear pain and hearing loss. Negative for ear discharge.   Skin: Positive for rash.     Physical Exam Triage Vital Signs ED Triage Vitals  Enc Vitals Group     BP 07/26/20 1004 126/90     Pulse Rate 07/26/20 1004 99     Resp 07/26/20 1004 18     Temp 07/26/20 1004 98.4 F (36.9 C)     Temp Source 07/26/20 1004 Oral     SpO2 07/26/20 1004 98 %     Weight 07/26/20 1002 200 lb (90.7 kg)     Height 07/26/20 1002 5' 9"  (1.753 m)     Head Circumference --      Peak  Flow --      Pain Score 07/26/20 1002 2     Pain Loc --      Pain Edu? --      Excl. in [place]? --    No data found.  Updated Vital Signs BP 126/90 (BP Location: Left Arm)   Pulse 99   Temp 98.4 F (36.9 C) (Oral)   Resp 18   Ht 5' 9"  (1.753 m)   Wt 200 lb (90.7 kg)   SpO2 98%   BMI 29.53 kg/m   Visual Acuity Right Eye Distance:   Left Eye Distance:   Bilateral Distance:    Right Eye Near:   Left Eye Near:    Bilateral Near:     Physical Exam Vitals and nursing note reviewed.  Constitutional:      General: He is not in acute distress.    Appearance: Normal appearance. He is obese.  HENT:     Head: Normocephalic and atraumatic.     Right Ear: Tympanic membrane, ear canal and external ear normal.     Left Ear: Tympanic membrane, ear canal and external ear normal.     Ears:     Comments: Patient has pain when palpating the eustachian tube externally on the right.  No tenderness on the left. Skin:    General: Skin is warm and dry.     Capillary Refill: Capillary  refill takes less than 2 seconds.     Findings: Rash present.     Comments: Patient has a diffuse erythematous rash across his upper chest and shoulders that extends up onto the lower part of his neck.  He has extension of the rash on the top of his shoulders posteriorly as well.  There is no greasy coating and the rash is not raised.  The rash is blanchable.  Neurological:     General: No focal deficit present.     Mental Status: He is alert and oriented to person, place, and time.  Psychiatric:        Mood and Affect: Mood normal.        Behavior: Behavior normal.        Thought Content: Thought content normal.        Judgment: Judgment normal.      UC Treatments / Results  Labs (all labs ordered are listed, but only abnormal results are displayed) Labs Reviewed - No data to display  EKG   Radiology No results found.  Procedures Procedures (including critical care time)  Medications Ordered in UC Medications - No data to display  Initial Impression / Assessment and Plan / UC Course  I have reviewed the triage vital signs and the nursing notes.  Pertinent labs & imaging results that were available during my care of the patient were reviewed by me and considered in my medical decision making (see chart for details).   Duration of right ear fullness that is been going on intermittently for a month.  Physical exam reveals pearly gray tympanic membranes bilaterally with normal light reflex and clear external auditory canals.  Patient states that he is unable to pneumatized his ear on the right but his left and will clear just fine.  Patient does have tenderness when palpating the eustachian tube on the right side externally.  Patient symptoms are consistent with eustachian tube dysfunction.  Will treat with sinus irrigation, have patient continue his Flonase, and use a Medrol Dosepak.  Patient is also interested in having a rash looked  at that is been present for years off and on.   He has a red nonraised rash across his upper chest, shoulders, and upper back.  The rash does extend onto his lower neck.  Patient states that he does sweat a lot.  The rash is itchy but it does not greasy.  The rash does not fluoresce under black light.  Suspect patient has a yeast infection of his skin and have encouraged him to use OTC Lotrimin twice daily.   Final Clinical Impressions(s) / UC Diagnoses   Final diagnoses:  Eustachian tube dysfunction, right  Yeast infection of the skin     Discharge Instructions     Apply Lotrimin to the rash on your chest, shoulders, and back twice daily until the rash is resolved and then continue applying for another 3 days.  Start the [place] when you get it and take it according to the package instructions.  This will help you with decrease inflammation and speak in clear eustachian tube.  Perform sinus irrigation with a NeilMed sinus rinse kit and distilled water twice daily to help clear your eustachian tube.  Continue doing your Flonase, 2 squirts in each nostril at bedtime, and follow each  Squirts with 1 squirt of nasal saline.  Count to 10 let the particles settle to make your Flonase more effective.  If your symptoms do not resolve, or they worsen, you need to follow-up with an ear nose and throat specialist.    ED Prescriptions    Medication Sig Dispense Auth. Provider   methylPREDNISolone (MEDROL DOSEPAK) 4 MG TBPK tablet Take according to the package insert. 1 each Margarette Canada, NP     PDMP not reviewed this encounter.   Margarette Canada, NP 07/26/20 1054

## 2020-07-26 NOTE — ED Triage Notes (Signed)
Patient c/o right ear fullness that has been going on for several months. He states the right side of his neck is swollen. He also reports a rash on his upper body that has been going on for several years.

## 2020-07-26 NOTE — Discharge Instructions (Addendum)
Apply Lotrimin to the rash on your chest, shoulders, and back twice daily until the rash is resolved and then continue applying for another 3 days.  Start the [place] when you get it and take it according to the package instructions.  This will help you with decrease inflammation and speak in clear eustachian tube.  Perform sinus irrigation with a NeilMed sinus rinse kit and distilled water twice daily to help clear your eustachian tube.  Continue doing your Flonase, 2 squirts in each nostril at bedtime, and follow each  Squirts with 1 squirt of nasal saline.  Count to 10 let the particles settle to make your Flonase more effective.  If your symptoms do not resolve, or they worsen, you need to follow-up with an ear nose and throat specialist.

## 2021-01-12 ENCOUNTER — Emergency Department
Admission: EM | Admit: 2021-01-12 | Discharge: 2021-01-12 | Disposition: A | Discharge status: Home / Self Care | Payer: Self-pay | Attending: Student in an Organized Health Care Education/Training Program | Admitting: Student in an Organized Health Care Education/Training Program

## 2021-01-12 DIAGNOSIS — T40601A Poisoning by unspecified narcotics, accidental (unintentional), initial encounter: Secondary | ICD-10-CM

## 2021-01-12 DIAGNOSIS — F17210 Nicotine dependence, cigarettes, uncomplicated: Secondary | ICD-10-CM | POA: Insufficient documentation

## 2021-01-12 DIAGNOSIS — I10 Essential (primary) hypertension: Secondary | ICD-10-CM | POA: Insufficient documentation

## 2021-01-12 DIAGNOSIS — Z79899 Other long term (current) drug therapy: Secondary | ICD-10-CM | POA: Insufficient documentation

## 2021-01-12 DIAGNOSIS — T402X1A Poisoning by other opioids, accidental (unintentional), initial encounter: Secondary | ICD-10-CM | POA: Insufficient documentation

## 2021-01-12 LAB — CBC WITH DIFFERENTIAL/PLATELET
Abs Immature Granulocytes: 0.09 10*3/uL — ABNORMAL HIGH (ref 0.00–0.07)
Basophils Absolute: 0.1 10*3/uL (ref 0.0–0.1)
Basophils Relative: 1 %
Eosinophils Absolute: 0 10*3/uL (ref 0.0–0.5)
Eosinophils Relative: 1 %
HCT: 47.3 % (ref 39.0–52.0)
Hemoglobin: 15.7 g/dL (ref 13.0–17.0)
Immature Granulocytes: 1 %
Lymphocytes Relative: 31 %
Lymphs Abs: 2.6 10*3/uL (ref 0.7–4.0)
MCH: 29.2 pg (ref 26.0–34.0)
MCHC: 33.2 g/dL (ref 30.0–36.0)
MCV: 87.9 fL (ref 80.0–100.0)
Monocytes Absolute: 0.5 10*3/uL (ref 0.1–1.0)
Monocytes Relative: 6 %
Neutro Abs: 5 10*3/uL (ref 1.7–7.7)
Neutrophils Relative %: 60 %
Platelets: 275 10*3/uL (ref 150–400)
RBC: 5.38 MIL/uL (ref 4.22–5.81)
RDW: 13.8 % (ref 11.5–15.5)
WBC: 8.2 10*3/uL (ref 4.0–10.5)
nRBC: 0 % (ref 0.0–0.2)

## 2021-01-12 LAB — URINE DRUG SCREEN, QUALITATIVE (ARMC ONLY)
Amphetamines, Ur Screen: NOT DETECTED
Barbiturates, Ur Screen: NOT DETECTED
Benzodiazepine, Ur Scrn: NOT DETECTED
Cannabinoid 50 Ng, Ur ~~LOC~~: NOT DETECTED
Cocaine Metabolite,Ur ~~LOC~~: NOT DETECTED
MDMA (Ecstasy)Ur Screen: NOT DETECTED
Methadone Scn, Ur: NOT DETECTED
Opiate, Ur Screen: NOT DETECTED
Phencyclidine (PCP) Ur S: NOT DETECTED
Tricyclic, Ur Screen: NOT DETECTED

## 2021-01-12 LAB — COMPREHENSIVE METABOLIC PANEL
ALT: 32 U/L (ref 0–44)
AST: 39 U/L (ref 15–41)
Albumin: 3.6 g/dL (ref 3.5–5.0)
Alkaline Phosphatase: 72 U/L (ref 38–126)
Anion gap: 7 (ref 5–15)
BUN: 8 mg/dL (ref 6–20)
CO2: 23 mmol/L (ref 22–32)
Calcium: 8.2 mg/dL — ABNORMAL LOW (ref 8.9–10.3)
Chloride: 102 mmol/L (ref 98–111)
Creatinine, Ser: 0.95 mg/dL (ref 0.61–1.24)
GFR, Estimated: >60 mL/min (ref >60)
Glucose, Bld: 165 mg/dL — ABNORMAL HIGH (ref 70–99)
Potassium: 3.1 mmol/L — ABNORMAL LOW (ref 3.5–5.1)
Sodium: 132 mmol/L — ABNORMAL LOW (ref 135–145)
Total Bilirubin: 0.7 mg/dL (ref 0.3–1.2)
Total Protein: 6.7 g/dL (ref 6.5–8.1)

## 2021-01-12 LAB — ACETAMINOPHEN LEVEL: Acetaminophen (Tylenol), Serum: <10 ug/mL — ABNORMAL LOW (ref 10–30)

## 2021-01-12 LAB — SALICYLATE LEVEL: Salicylate Lvl: <7 mg/dL — ABNORMAL LOW (ref 7.0–30.0)

## 2021-01-12 LAB — ETHANOL: Alcohol, Ethyl (B): 98 mg/dL — ABNORMAL HIGH (ref <10)

## 2021-01-12 MED ORDER — SODIUM CHLORIDE 0.9 % IV BOLUS
1000.0000 mL | Freq: Once | INTRAVENOUS | Status: AC
  Administered 2021-01-12: 1000 mL via INTRAVENOUS

## 2021-01-12 NOTE — ED Notes (Signed)
Ginger ale provided, updated poc

## 2021-01-12 NOTE — ED Notes (Signed)
Ice water provided.

## 2021-01-12 NOTE — ED Triage Notes (Signed)
Pt admits to 6 beers and snorted fentanyl, friend found unresponsive, given 6mg  narcan total. Gcs 15, denies SI or complaints

## 2021-01-12 NOTE — ED Notes (Signed)
MD Roxan Hockey notified that mother is a bedside; patient requesting disposition home

## 2021-01-12 NOTE — ED Provider Notes (Signed)
[facility] Emergency Department Provider Note    Event Date/Time   First MD Initiated Contact with Patient 01/12/21 2107     (approximate)  I have reviewed the triage vital signs and the nursing notes.   HISTORY  Chief Complaint Drug Overdose    HPI Jermaine Alvarez is a 40 y.o. male below listed past medical history presents to the ER for evaluation of overdose.  Patient states he was drinking beers and then snorted fentanyl.  Has remote history of opiate use issues.  Went unresponsive requiring multiple doses of Narcan to resuscitate.  Arrival he denies any pain.  States he was not trying to harm self states he was just "partaking."  Patient over ruffle and is accompanied by his mother.   Past Medical History:  Diagnosis Date   Anxiety    Hypertension    Pyloric stenosis    Family History  Problem Relation Age of Onset   Other Mother        unknown medical history   Heart attack Father 64   Past Surgical History:  Procedure Laterality Date   pyloric stenosis     There are no problems to display for this patient.     Prior to Admission medications   Medication Sig Start Date End Date Taking? Authorizing Provider  escitalopram (LEXAPRO) 10 MG tablet Take 10 mg by mouth daily. 03/21/18   [provider]  lisinopril (PRINIVIL,ZESTRIL) 20 MG tablet Take by mouth. 11/06/17   [provider]  methylPREDNISolone (MEDROL DOSEPAK) 4 MG TBPK tablet Take according to the package insert. 07/26/20   Becky Augusta, NP  omeprazole (PRILOSEC) 20 MG capsule Take by mouth. 08/01/16   [provider]    Allergies Patient has no known allergies.    Social History Social History   Tobacco Use   Smoking status: Every Day    Packs/day: 1.00    Years: 20.00    Pack years: 20.00    Types: Cigarettes   Smokeless tobacco: Never  Vaping Use   Vaping Use: Never used  Substance Use Topics   Alcohol use: Yes    Alcohol/week:  14.0 standard drinks    Types: 14 Cans of beer per week   Drug use: Not Currently    Types: Cocaine, Marijuana    Comment: pain pills, "whatever I can get"  04/03/20- last use ~3 months ago     Review of Systems Patient denies headaches, rhinorrhea, blurry vision, numbness, shortness of breath, chest pain, edema, cough, abdominal pain, nausea, vomiting, diarrhea, dysuria, fevers, rashes or hallucinations unless otherwise stated above in HPI. ____________________________________________   PHYSICAL EXAM:  VITAL SIGNS: Vitals:   01/12/21 2145 01/12/21 2309  BP: (!) 99/59 (!) 153/92  Pulse: 97 87  Resp: (!) 21 16  Temp:    SpO2: 93% 97%    Constitutional: Alert and oriented.  Eyes: Conjunctivae are normal.  Head: Atraumatic. Nose: No congestion/rhinnorhea. Mouth/Throat: Mucous membranes are moist.   Neck: No stridor. Painless ROM.  Cardiovascular: Normal rate, regular rhythm. Grossly normal heart sounds.  Good peripheral circulation. Respiratory: Normal respiratory effort.  No retractions. Lungs CTAB. Gastrointestinal: Soft and nontender. No distention. No abdominal bruits. No CVA tenderness. Genitourinary:  Musculoskeletal: No lower extremity tenderness nor edema.  No joint effusions. Neurologic:  Normal speech and language. No gross focal neurologic deficits are appreciated. No facial droop Skin:  Skin is warm, dry and intact. No rash noted. Psychiatric: Mood and affect are normal.  Speech and behavior are normal.  ____________________________________________   LABS (all labs ordered are listed, but only abnormal results are displayed)  Results for orders placed or performed during the hospital encounter of 01/12/21 (from the past 24 hour(s))  Comprehensive metabolic panel     Status: Abnormal   Collection Time: 01/12/21  8:51 PM  Result Value Ref Range   Sodium 132 (L) 135 - 145 mmol/L   Potassium 3.1 (L) 3.5 - 5.1 mmol/L   Chloride 102 98 - 111 mmol/L   CO2 23 22 -  32 mmol/L   Glucose, Bld 165 (H) 70 - 99 mg/dL   BUN 8 6 - 20 mg/dL   Creatinine, Ser 5.46 0.61 - 1.24 mg/dL   Calcium 8.2 (L) 8.9 - 10.3 mg/dL   Total Protein 6.7 6.5 - 8.1 g/dL   Albumin 3.6 3.5 - 5.0 g/dL   AST 39 15 - 41 U/L   ALT 32 0 - 44 U/L   Alkaline Phosphatase 72 38 - 126 U/L   Total Bilirubin 0.7 0.3 - 1.2 mg/dL   GFR, Estimated >56 >81 mL/min   Anion gap 7 5 - 15  Salicylate level     Status: Abnormal   Collection Time: 01/12/21  8:51 PM  Result Value Ref Range   Salicylate Lvl <7.0 (L) 7.0 - 30.0 mg/dL  Acetaminophen level     Status: Abnormal   Collection Time: 01/12/21  8:51 PM  Result Value Ref Range   Acetaminophen (Tylenol), Serum <10 (L) 10 - 30 ug/mL  Ethanol     Status: Abnormal   Collection Time: 01/12/21  8:51 PM  Result Value Ref Range   Alcohol, Ethyl (B) 98 (H) <10 mg/dL  CBC WITH DIFFERENTIAL     Status: Abnormal   Collection Time: 01/12/21  8:51 PM  Result Value Ref Range   WBC 8.2 4.0 - 10.5 K/uL   RBC 5.38 4.22 - 5.81 MIL/uL   Hemoglobin 15.7 13.0 - 17.0 g/dL   HCT 27.5 17.0 - 01.7 %   MCV 87.9 80.0 - 100.0 fL   MCH 29.2 26.0 - 34.0 pg   MCHC 33.2 30.0 - 36.0 g/dL   RDW 49.4 49.6 - 75.9 %   Platelets 275 150 - 400 K/uL   nRBC 0.0 0.0 - 0.2 %   Neutrophils Relative % 60 %   Neutro Abs 5.0 1.7 - 7.7 K/uL   Lymphocytes Relative 31 %   Lymphs Abs 2.6 0.7 - 4.0 K/uL   Monocytes Relative 6 %   Monocytes Absolute 0.5 0.1 - 1.0 K/uL   Eosinophils Relative 1 %   Eosinophils Absolute 0.0 0.0 - 0.5 K/uL   Basophils Relative 1 %   Basophils Absolute 0.1 0.0 - 0.1 K/uL   Immature Granulocytes 1 %   Abs Immature Granulocytes 0.09 (H) 0.00 - 0.07 K/uL  Urine Drug Screen, Qualitative     Status: None   Collection Time: 01/12/21  9:42 PM  Result Value Ref Range   Tricyclic, Ur Screen NONE DETECTED NONE DETECTED   Amphetamines, Ur Screen NONE DETECTED NONE DETECTED   MDMA (Ecstasy)Ur Screen NONE DETECTED NONE DETECTED   Cocaine Metabolite,Ur [place]  NONE DETECTED NONE DETECTED   Opiate, Ur Screen NONE DETECTED NONE DETECTED   Phencyclidine (PCP) Ur S NONE DETECTED NONE DETECTED   Cannabinoid 50 Ng, Ur [place] NONE DETECTED NONE DETECTED   Barbiturates, Ur Screen NONE DETECTED NONE DETECTED   Benzodiazepine, Ur Scrn NONE DETECTED NONE DETECTED  Methadone Scn, Ur NONE DETECTED NONE DETECTED   ____________________________________________  EKG My review and personal interpretation at Time: 20:49   Indication: od  Rate: 100  Rhythm:  Axis: normal Other: nonspecific st and no stemi ____________________________________________  RADIOLOGY   ____________________________________________   PROCEDURES  Procedure(s) performed:  Procedures    Critical Care performed: no ____________________________________________   INITIAL IMPRESSION / ASSESSMENT AND PLAN / ED COURSE  Pertinent labs & imaging results that were available during my care of the patient were reviewed by me and considered in my medical decision making (see chart for details).   DDX: Accidental overdose, polysubstance abuse, intoxication  COUPER JUNCAJ is a 40 y.o. who presents to the ED with presentation as described above here after having received Narcan for accidental overdose of snorted fentanyl.  He is asymptomatic right now.  Protecting his airway.  We will plan to observe here in the ER.  No intent for self-harm.  No criteria for IVC.  He is appropriately remorseful and requesting resources for substance abuse counseling.  Clinical Course as of 01/12/21 2321  Sat Jan 12, 2021  2318 Patient stable nontoxic-appearing.  Appropriate for outpatient follow-up. [PR]    Clinical Course User Index [PR] Willy Eddy, MD    The patient was evaluated in Emergency Department today for the symptoms described in the history of present illness. He/she was evaluated in the context of the global COVID-19 pandemic, which necessitated consideration that the patient  might be at risk for infection with the SARS-CoV-2 virus that causes COVID-19. Institutional protocols and algorithms that pertain to the evaluation of patients at risk for COVID-19 are in a state of rapid change based on information released by regulatory bodies including the CDC and federal and state organizations. These policies and algorithms were followed during the patient's care in the ED.  As part of my medical decision making, I reviewed the following data within the electronic MEDICAL RECORD NUMBER Nursing notes reviewed and incorporated, Labs reviewed, notes from prior ED visits and [place] Controlled Substance Database   ____________________________________________   FINAL CLINICAL IMPRESSION(S) / ED DIAGNOSES  Final diagnoses:  Opiate overdose, accidental or unintentional, initial encounter (HCC)      NEW MEDICATIONS STARTED DURING THIS VISIT:  New Prescriptions   No medications on file     Note:  This document was prepared using Dragon voice recognition software and may include unintentional dictation errors.    Willy Eddy, MD 01/12/21 2322

## 2021-02-26 ENCOUNTER — Ambulatory Visit: Payer: Self-pay | Admitting: Urology

## 2021-03-05 NOTE — Progress Notes (Signed)
03/06/21 10:44 AM   Jermaine Alvarez 11/02/1980 [id number]  Referring provider:  Jerl Mina, MD [address] [facility] [place],  [place] [address] Chief Complaint  Patient presents with   Hematuria     HPI: Jermaine Alvarez is a 40 y.o.male who presents today for further evaluation of hematuria.   He was previously followed at [facility]  Recent urinalysis on 01/11/2021 revealed small blood, 4-10 WBCs, and rare bacteria.  This has been seen present several times including on 4-10 RBC on 5/22.    He is a current smoker.  His urine today had some microscopic blood.   He is accompanied by his mother.   He reports a year and a half ago he was experiencing a pink tinged urine to his urine which made him follow-up with his doctor. He has had a struggle with started stream.   He states he does experiences middle back pain.   + family history of kidney stones  PMH: Past Medical History:  Diagnosis Date   Anxiety    Hypertension    Pyloric stenosis     Surgical History: Past Surgical History:  Procedure Laterality Date   pyloric stenosis      Home Medications:  Allergies as of 03/06/2021   No Known Allergies      Medication List        Accurate as of March 06, 2021 10:44 AM. If you have any questions, ask your nurse or doctor.          STOP taking these medications    methylPREDNISolone 4 MG Tbpk tablet Commonly known as: MEDROL DOSEPAK Stopped by: Vanna Scotland, MD       TAKE these medications    escitalopram 20 MG tablet Commonly known as: LEXAPRO Take by mouth. What changed: Another medication with the same name was removed. Continue taking this medication, and follow the directions you see here. Changed by: Vanna Scotland, MD   lisinopril 20 MG tablet Commonly known as: ZESTRIL Take by mouth.   omeprazole 20 MG capsule Commonly known as: PRILOSEC Take by mouth.        Allergies: No Known Allergies  Family  History: Family History  Problem Relation Age of Onset   Other Mother        unknown medical history   Heart attack Father 66    Social History:  reports that he has been smoking cigarettes. He has a 20.00 pack-year smoking history. He has never used smokeless tobacco. He reports current alcohol use of about 14.0 standard drinks per week. He reports that he does not currently use drugs after having used the following drugs: Cocaine and Marijuana.   Physical Exam: There were no vitals taken for this visit.  Constitutional:  Alert and oriented, No acute distress. HEENT: [place] AT, moist mucus membranes.  Trachea midline, no masses. Cardiovascular: No clubbing, cyanosis, or edema. Respiratory: Normal respiratory effort, no increased work of breathing. Skin: No rashes, bruises or suspicious lesions. Neurologic: Grossly intact, no focal deficits, moving all 4 extremities. Psychiatric: Normal mood and affect.  Laboratory Data:  Lab Results  Component Value Date   CREATININE 0.95 01/12/2021     Urinalysis 3-10 RBCs    Assessment & Plan:    Gross Hematuria / microscopic hematuria - Due to his current history of smoking and recent urinalysis showing positive blood recommend Cystoscopy and renal ultrasound to rule out any underlying issues.  -considered CT urogram, however, based on age, risk  of underlying UTUC low thus will opt for less expensive study with lower radiation- he understands and agrees with this plan -discussed risks and benefits -offered but declined STI testing  2. Smoker Risk factor   F/u RUS and renal ultrasound  I,Kailey Littlejohn,acting as a scribe for Vanna Scotland, MD.,have documented all relevant documentation on the behalf of Vanna Scotland, MD,as directed by  Vanna Scotland, MD while in the presence of Vanna Scotland, MD.   I have reviewed the above documentation for accuracy and completeness, and I agree with the above.   Vanna Scotland,  MD   [facility] [address] [place], [place] [address] [phone]

## 2021-03-06 ENCOUNTER — Ambulatory Visit (INDEPENDENT_AMBULATORY_CARE_PROVIDER_SITE_OTHER): Payer: Self-pay | Admitting: Urology

## 2021-03-06 ENCOUNTER — Other Ambulatory Visit: Payer: Self-pay

## 2021-03-06 DIAGNOSIS — R319 Hematuria, unspecified: Secondary | ICD-10-CM

## 2021-03-06 NOTE — Patient Instructions (Signed)
Cystoscopy Cystoscopy is a procedure that is used to help diagnose and sometimes treat conditions that affect the lower urinary tract. The lower urinary tract includes the bladder and the urethra. The urethra is the tube that drains urine from the bladder. Cystoscopy is done using a thin, tube-shaped instrument with a light and camera at the end (cystoscope). The cystoscope may be hard or flexible, depending on the goal of the procedure. The cystoscope is inserted through the urethra, into the bladder. Cystoscopy may be recommended if you have: Urinary tract infections that keep coming back. Blood in the urine (hematuria). An inability to control when you urinate (urinary incontinence) or an overactive bladder. Unusual cells found in a urine sample. A blockage in the urethra, such as a urinary stone. Painful urination. An abnormality in the bladder found during an intravenous pyelogram (IVP) or CT scan. Cystoscopy may also be done to remove a sample of tissue to be examined under a microscope (biopsy). What are the risks? Generally, this is a safe procedure. However, problems may occur, including: Infection. Bleeding.  What happens during the procedure?  You will be given one or more of the following: A medicine to numb the area (local anesthetic). The area around the opening of your urethra will be cleaned. The cystoscope will be passed through your urethra into your bladder. Germ-free (sterile) fluid will flow through the cystoscope to fill your bladder. The fluid will stretch your bladder so that your health care provider can clearly examine your bladder walls. Your doctor will look at the urethra and bladder. The cystoscope will be removed The procedure may vary among health care providers  What can I expect after the procedure? After the procedure, it is common to have: Some soreness or pain in your abdomen and urethra. Urinary symptoms. These include: Mild pain or burning when you  urinate. Pain should stop within a few minutes after you urinate. This may last for up to 1 week. A small amount of blood in your urine for several days. Feeling like you need to urinate but producing only a small amount of urine. Follow these instructions at home: General instructions Return to your normal activities as told by your health care provider.  Do not drive for 24 hours if you were given a sedative during your procedure. Watch for any blood in your urine. If the amount of blood in your urine increases, call your health care provider. If a tissue sample was removed for testing (biopsy) during your procedure, it is up to you to get your test results. Ask your health care provider, or the department that is doing the test, when your results will be ready. Drink enough fluid to keep your urine pale yellow. Keep all follow-up visits as told by your health care provider. This is important. Contact a health care provider if you: Have pain that gets worse or does not get better with medicine, especially pain when you urinate. Have trouble urinating. Have more blood in your urine. Get help right away if you: Have blood clots in your urine. Have abdominal pain. Have a fever or chills. Are unable to urinate. Summary Cystoscopy is a procedure that is used to help diagnose and sometimes treat conditions that affect the lower urinary tract. Cystoscopy is done using a thin, tube-shaped instrument with a light and camera at the end. After the procedure, it is common to have some soreness or pain in your abdomen and urethra. Watch for any blood in your urine.   If the amount of blood in your urine increases, call your health care provider. If you were prescribed an antibiotic medicine, take it as told by your health care provider. Do not stop taking the antibiotic even if you start to feel better. This information is not intended to replace advice given to you by your health care provider. Make  sure you discuss any questions you have with your health care provider. Document Revised: 05/18/2018 Document Reviewed: 05/18/2018 Elsevier Patient Education  2020 [employer]  

## 2021-03-07 LAB — MICROSCOPIC EXAMINATION: Bacteria, UA: NONE SEEN

## 2021-03-07 LAB — URINALYSIS, COMPLETE
Bilirubin, UA: NEGATIVE
Glucose, UA: NEGATIVE
Ketones, UA: NEGATIVE
Leukocytes,UA: NEGATIVE
Nitrite, UA: NEGATIVE
Protein,UA: NEGATIVE
Specific Gravity, UA: 1.02 (ref 1.005–1.030)
Urobilinogen, Ur: 0.2 mg/dL (ref 0.2–1.0)
pH, UA: 7 (ref 5.0–7.5)

## 2021-03-27 ENCOUNTER — Ambulatory Visit
Admission: RE | Admit: 2021-03-27 | Discharge: 2021-03-27 | Disposition: A | Discharge status: Home / Self Care | Payer: Self-pay | Source: Ambulatory Visit | Attending: Urology | Admitting: Urology

## 2021-03-27 ENCOUNTER — Other Ambulatory Visit: Payer: Self-pay

## 2021-03-27 DIAGNOSIS — R319 Hematuria, unspecified: Secondary | ICD-10-CM | POA: Insufficient documentation

## 2021-04-03 IMAGING — CR DG ABDOMEN 1V
2 series · 2 of 2 positions shown · non-contrast
Comparison: None.

CLINICAL DATA: Right chest and flank pain for approximately 3 days.
The patient felt some pain in the right chest when he leaned into a
truck window 10 days ago. Initial encounter.

EXAM:
ABDOMEN - 1 VIEW

[abdomen kub (1 of 2)]
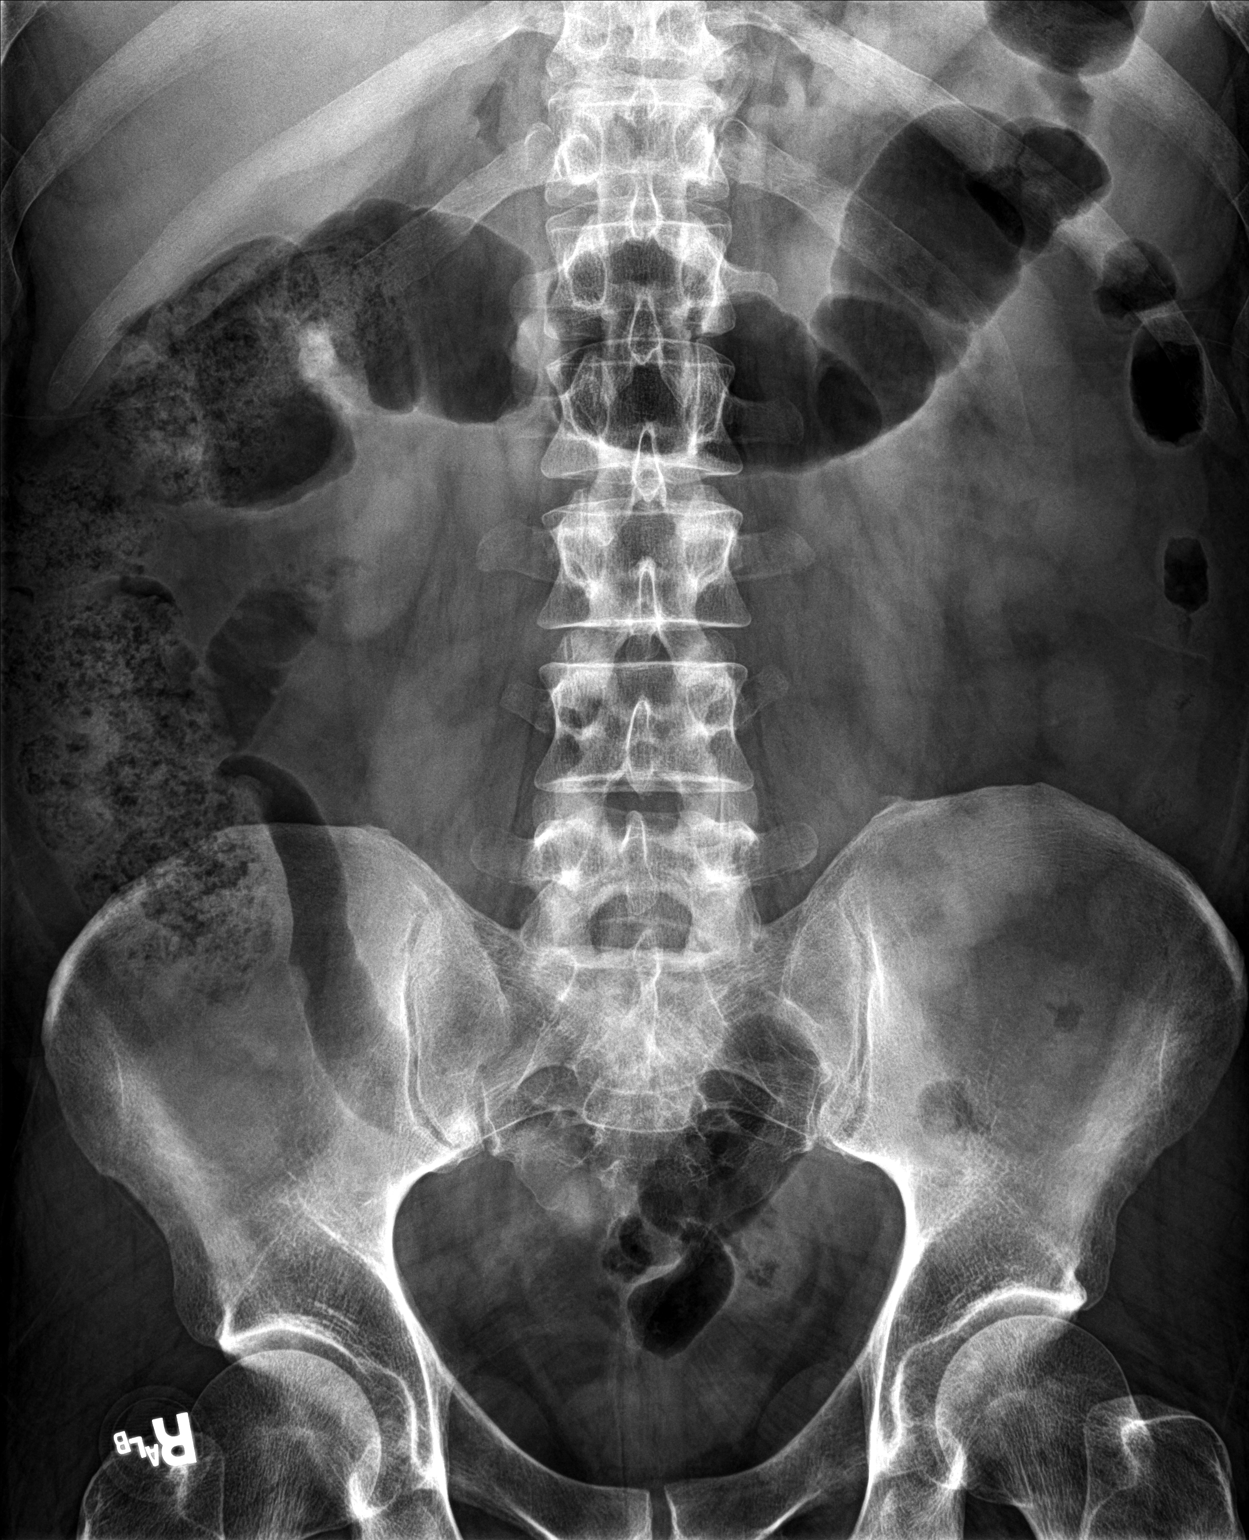

[abdomen kub (2 of 2)]
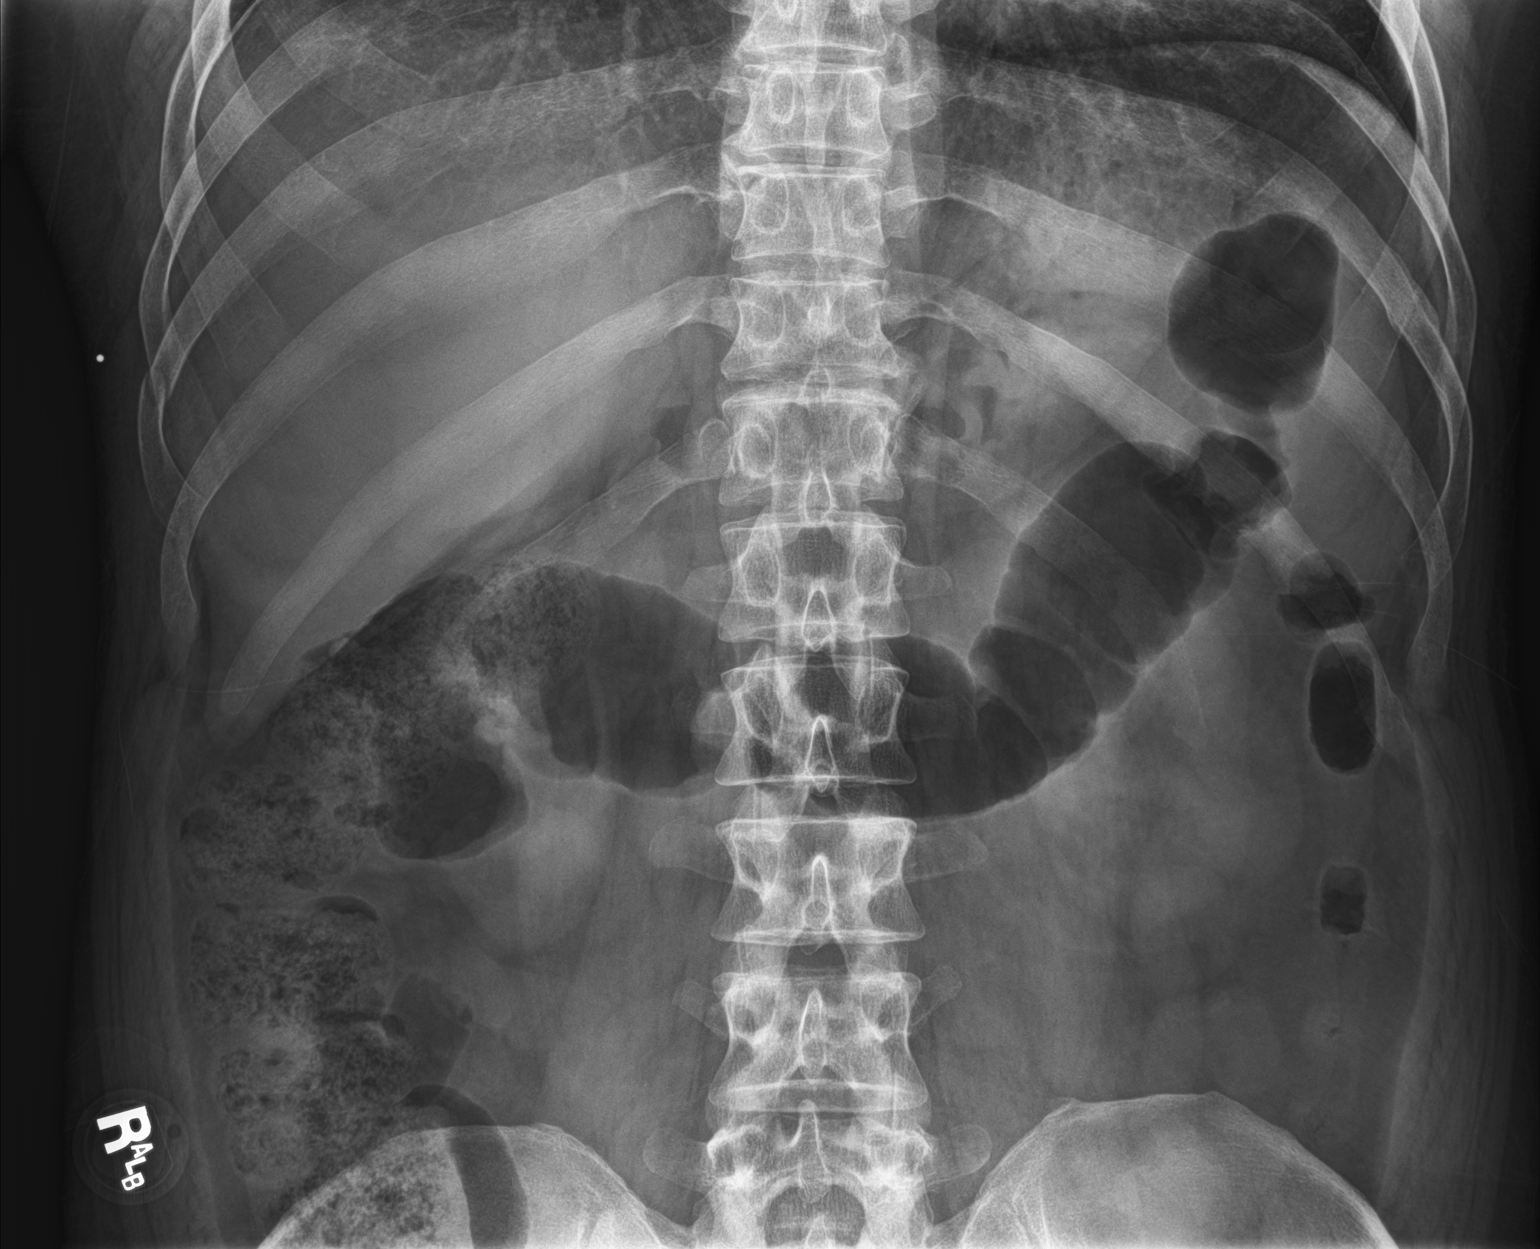

[2 of 2 positions shown; findings below may reference images not displayed]

FINDINGS: The bowel gas pattern is normal. No radio-opaque calculi or other
significant radiographic abnormality are seen in the abdomen. Right
ninth rib fracture is seen on dedicated plain films of the ribs
noted.
IMPRESSION: Right ninth rib fracture.  Otherwise negative.

## 2021-04-09 ENCOUNTER — Other Ambulatory Visit: Payer: Self-pay | Admitting: Urology

## 2021-04-17 NOTE — Progress Notes (Signed)
   04/17/2021 CC:  Chief Complaint  Patient presents with   Cysto    W/RUS    HPI: Jermaine Alvarez is a 40 y.o. male with a personal history of gross hematuria, who presents today for a cystoscopy.    Recent urinalysis on 01/11/2021 revealed small blood, 4-10 WBCs, and rare bacteria.  This has been seen present several times including on 4-10 RBC on 5/22.     He is a current smoker.   03/28/2021 RUS was negative.   Urinalysis revealed protein and blood in urine.   He is accompanied by his mother today. He reports that he does drink alcohol. He has been struggling with this for awhile, he states he is going to try and go to AA for his alcoholism. He has been to AA before.    Vitals:   04/18/21 1128  BP: (!) 159/112  Pulse: (!) 126  NED. A&Ox3.   No respiratory distress   Abd soft, NT, ND Normal phallus with bilateral descended testicles  Cystoscopy Procedure Note  Patient identification was confirmed, informed consent was obtained, and patient was prepped using Betadine solution.  Lidocaine jelly was administered per urethral meatus.     Pre-Procedure: - Inspection reveals a normal caliber ureteral meatus.  Procedure: The flexible cystoscope was introduced without difficulty - No urethral strictures/lesions are present. - Enlarged prostate  - Normal bladder neck - Bilateral ureteral orifices identified - Bladder mucosa  reveals no ulcers, tumors, or lesions - No bladder stones - No trabeculation  Retroflexion shows unremarkable    Post-Procedure: - Patient tolerated the procedure well   Assessment/ Plan:  Gross Hematuria/ microscopic hematuria  - Today he has increasing amount RBC today (11-30) along with 2 + proteins  - Cystoscopy reassuring today  - recommend referral to nephrology   2. Alcohol abuse - He disclosed today his concern about his alcohol abuse  - Discussed importance of cessation of intake of alcohol    I,Kailey Littlejohn,acting  as a scribe for Vanna Scotland, MD.,have documented all relevant documentation on the behalf of Vanna Scotland, MD,as directed by  Vanna Scotland, MD while in the presence of Vanna Scotland, MD.  I have reviewed the above documentation for accuracy and completeness, and I agree with the above.   Vanna Scotland, MD

## 2021-04-18 ENCOUNTER — Ambulatory Visit (INDEPENDENT_AMBULATORY_CARE_PROVIDER_SITE_OTHER): Payer: Self-pay | Admitting: Urology

## 2021-04-18 ENCOUNTER — Other Ambulatory Visit: Payer: Self-pay

## 2021-04-18 VITALS — BP 159/112 | HR 126 | Ht 70.0 in | Wt 190.0 lb

## 2021-04-18 DIAGNOSIS — R319 Hematuria, unspecified: Secondary | ICD-10-CM

## 2021-04-18 LAB — URINALYSIS, COMPLETE
Bilirubin, UA: NEGATIVE
Glucose, UA: NEGATIVE
Leukocytes,UA: NEGATIVE
Nitrite, UA: NEGATIVE
Specific Gravity, UA: 1.025 (ref 1.005–1.030)
Urobilinogen, Ur: 1 mg/dL (ref 0.2–1.0)
pH, UA: 6.5 (ref 5.0–7.5)

## 2021-04-18 LAB — MICROSCOPIC EXAMINATION: Bacteria, UA: NONE SEEN

## 2021-04-19 ENCOUNTER — Other Ambulatory Visit: Payer: Self-pay | Admitting: Urology

## 2021-07-15 ENCOUNTER — Other Ambulatory Visit
Admission: RE | Admit: 2021-07-15 | Discharge: 2021-07-15 | Disposition: A | Discharge status: Home / Self Care | Payer: Self-pay | Attending: Nephrology | Admitting: Nephrology

## 2021-07-15 ENCOUNTER — Other Ambulatory Visit: Payer: Self-pay

## 2021-07-15 DIAGNOSIS — R3129 Other microscopic hematuria: Secondary | ICD-10-CM | POA: Insufficient documentation

## 2021-07-15 DIAGNOSIS — I10 Essential (primary) hypertension: Secondary | ICD-10-CM | POA: Insufficient documentation

## 2021-07-15 LAB — RENAL FUNCTION PANEL
Albumin: 4 g/dL (ref 3.5–5.0)
Anion gap: 10 (ref 5–15)
BUN: 8 mg/dL (ref 6–20)
CO2: 24 mmol/L (ref 22–32)
Calcium: 9.5 mg/dL (ref 8.9–10.3)
Chloride: 100 mmol/L (ref 98–111)
Creatinine, Ser: 0.88 mg/dL (ref 0.61–1.24)
GFR, Estimated: >60 mL/min (ref >60)
Glucose, Bld: 112 mg/dL — ABNORMAL HIGH (ref 70–99)
Phosphorus: 2.8 mg/dL (ref 2.5–4.6)
Potassium: 4.4 mmol/L (ref 3.5–5.1)
Sodium: 134 mmol/L — ABNORMAL LOW (ref 135–145)

## 2021-07-16 LAB — MICROALBUMIN / CREATININE URINE RATIO
Creatinine, Urine: 175.3 mg/dL
Microalb Creat Ratio: 4 mg/g creat (ref 0–29)
Microalb, Ur: 6.7 ug/mL — ABNORMAL HIGH

## 2021-08-15 ENCOUNTER — Other Ambulatory Visit
Admission: RE | Admit: 2021-08-15 | Discharge: 2021-08-15 | Disposition: A | Discharge status: Home / Self Care | Payer: Self-pay | Attending: Nephrology | Admitting: Nephrology

## 2021-08-15 ENCOUNTER — Other Ambulatory Visit: Payer: Self-pay

## 2021-08-15 DIAGNOSIS — R3129 Other microscopic hematuria: Secondary | ICD-10-CM | POA: Insufficient documentation

## 2021-08-15 DIAGNOSIS — I10 Essential (primary) hypertension: Secondary | ICD-10-CM | POA: Insufficient documentation

## 2021-08-15 LAB — RENAL FUNCTION PANEL
Albumin: 4 g/dL (ref 3.5–5.0)
Anion gap: 10 (ref 5–15)
BUN: 11 mg/dL (ref 6–20)
CO2: 24 mmol/L (ref 22–32)
Calcium: 9.4 mg/dL (ref 8.9–10.3)
Chloride: 99 mmol/L (ref 98–111)
Creatinine, Ser: 0.83 mg/dL (ref 0.61–1.24)
GFR, Estimated: >60 mL/min (ref >60)
Glucose, Bld: 140 mg/dL — ABNORMAL HIGH (ref 70–99)
Phosphorus: 3.4 mg/dL (ref 2.5–4.6)
Potassium: 4.1 mmol/L (ref 3.5–5.1)
Sodium: 133 mmol/L — ABNORMAL LOW (ref 135–145)

## 2021-08-15 LAB — URINALYSIS, COMPLETE (UACMP) WITH MICROSCOPIC
Glucose, UA: NEGATIVE mg/dL
Ketones, ur: NEGATIVE mg/dL
Leukocytes,Ua: NEGATIVE
Nitrite: NEGATIVE
Specific Gravity, Urine: 1.025 (ref 1.005–1.030)
pH: 6 (ref 5.0–8.0)

## 2021-08-16 LAB — MICROALBUMIN / CREATININE URINE RATIO
Creatinine, Urine: 277 mg/dL
Microalb Creat Ratio: 7 mg/g creat (ref 0–29)
Microalb, Ur: 19 ug/mL — ABNORMAL HIGH

## 2021-09-02 ENCOUNTER — Ambulatory Visit
Admission: EM | Admit: 2021-09-02 | Discharge: 2021-09-02 | Disposition: A | Discharge status: Home / Self Care | Payer: Self-pay | Attending: Emergency Medicine | Admitting: Emergency Medicine

## 2021-09-02 ENCOUNTER — Other Ambulatory Visit: Payer: Self-pay

## 2021-09-02 ENCOUNTER — Encounter: Payer: Self-pay | Admitting: Emergency Medicine

## 2021-09-02 DIAGNOSIS — K122 Cellulitis and abscess of mouth: Secondary | ICD-10-CM | POA: Insufficient documentation

## 2021-09-02 LAB — GROUP A STREP BY PCR: Group A Strep by PCR: NOT DETECTED

## 2021-09-02 MED ORDER — KETOROLAC TROMETHAMINE 60 MG/2ML IM SOLN
30.0000 mg | Freq: Once | INTRAMUSCULAR | Status: AC
  Administered 2021-09-02: 30 mg via INTRAMUSCULAR

## 2021-09-02 MED ORDER — IBUPROFEN 800 MG PO TABS: 800.0000 mg | ORAL_TABLET | Freq: Three times a day (TID) | ORAL | 0 refills | Status: DC

## 2021-09-02 MED ORDER — AMOXICILLIN-POT CLAVULANATE 875-125 MG PO TABS: 1.0000 | ORAL_TABLET | Freq: Two times a day (BID) | ORAL | 0 refills | Status: DC

## 2021-09-02 MED ORDER — LIDOCAINE VISCOUS HCL 2 % MT SOLN: 15.0000 mL | OROMUCOSAL | 0 refills | Status: DC | PRN

## 2021-09-02 NOTE — ED Triage Notes (Signed)
Pt reports dental pain x 4 days. States he believes it may be an abscess on the left side.  ?Also reports sore throat and dry mouth x 5 days. States his son has strep throat.  ?

## 2021-09-02 NOTE — Discharge Instructions (Addendum)
Strep test is negative for the bacteria in the throat, therefore symptoms are most likely related to an infection in your check  which is possibly related to your teeth ? ?Take Augmentin twice daily for the next 7 days, this medicine is to clear the bacteria ? ?You may use ibuprofen every 8 hours in addition to Tylenol to help manage her pain ? ?You may gargle and spit lidocaine solution every 4 hours as needed to give a temporary numbing sensation and temporary relief of your pain ? ?Inside of your packet is a list of dental resources located across the state, if your symptoms recur or persist, she will need evaluation by specialist ?

## 2021-09-02 NOTE — ED Provider Notes (Signed)
?[facility] ? ? ? ?CSN: [id number] ?Arrival date & time: 09/02/21  1039 ? ? ?  ? ?History   ?Chief Complaint ?Chief Complaint  ?Patient presents with  ? Sore Throat  ? Dental Pain  ? ? ?HPI ?Jermaine Alvarez is a 41 y.o. male.  ? ?Patient presents with a fever, chills sore throat and dry mouth for 5 days, left lower gumline dental pain for 4 days with associated left cheek swelling and a subjective fever dental pain for 4 days.  Swallow but tolerating some food and liquids.  Attempted use of Tylenol which has not been effective.  Known sick contact with exposure to strep.  No pertinent medical history.  Denies need for dental work however has not seen a dentist in at least 20 years. ? ? ?Past Medical History:  ?Diagnosis Date  ? Anxiety   ? Hypertension   ? Pyloric stenosis   ? ? ?There are no problems to display for this patient. ? ? ?Past Surgical History:  ?Procedure Laterality Date  ? pyloric stenosis    ? ? ? ? ? ?Home Medications   ? ?Prior to Admission medications   ?Medication Sig Start Date End Date Taking? Authorizing Provider  ?escitalopram (LEXAPRO) 20 MG tablet Take by mouth. 11/13/20   [provider]  ?lisinopril (ZESTRIL) 20 MG tablet Take by mouth. 11/13/20   [provider]  ?omeprazole (PRILOSEC) 20 MG capsule Take by mouth. 08/01/16   [provider]  ? ? ?Family History ?Family History  ?Problem Relation Age of Onset  ? Other Mother   ?     unknown medical history  ? Heart attack Father 55  ? ? ?Social History ?Social History  ? ?Tobacco Use  ? Smoking status: Every Day  ?  Packs/day: 1.00  ?  Years: 20.00  ?  Pack years: 20.00  ?  Types: Cigarettes  ? Smokeless tobacco: Never  ?Vaping Use  ? Vaping Use: Never used  ?Substance Use Topics  ? Alcohol use: Yes  ?  Alcohol/week: 14.0 standard drinks  ?  Types: 14 Cans of beer per week  ? Drug use: Not Currently  ?  Types: Cocaine, Marijuana  ?  Comment: pain pills, "whatever I can get"  04/03/20- last use ~3  months ago   ? ? ? ?Allergies   ?Patient has no known allergies. ? ? ?Review of Systems ?Review of Systems  ?Constitutional:  Positive for chills and fever. Negative for activity change, appetite change, diaphoresis, fatigue and unexpected weight change.  ?HENT:  Positive for dental problem, ear pain, facial swelling and sore throat. Negative for congestion, drooling, ear discharge, hearing loss, mouth sores, nosebleeds, postnasal drip, rhinorrhea, sinus pressure, sinus pain, sneezing, tinnitus, trouble swallowing and voice change.   ?Respiratory: Negative.    ?Cardiovascular: Negative.   ?Gastrointestinal: Negative.   ?Neurological: Negative.   ? ? ?Physical Exam ?Triage Vital Signs ?ED Triage Vitals  ?Enc Vitals Group  ?   BP 09/02/21 1117 103/62  ?   Pulse Rate 09/02/21 1117 95  ?   Resp 09/02/21 1117 18  ?   Temp 09/02/21 1119 98.4 ?F (36.9 ?C)  ?   Temp Source 09/02/21 1119 Oral  ?   SpO2 09/02/21 1117 99 %  ?   Weight 09/02/21 1118 190 lb 0.6 oz (86.2 kg)  ?   Height 09/02/21 1118 5\' 10"  (1.778 m)  ?   Head Circumference --   ?  Peak Flow --   ?   Pain Score 09/02/21 1118 8  ?   Pain Loc --   ?   Pain Edu? --   ?   Excl. in [place]? --   ? ?No data found. ? ?Updated Vital Signs ?BP 103/62 (BP Location: Left Arm)   Pulse 95   Temp 98.4 ?F (36.9 ?C) (Oral)   Resp 18   Ht 5\' 10"  (1.778 m)   Wt 190 lb 0.6 oz (86.2 kg)   SpO2 99%   BMI 27.27 kg/m?  ? ?Visual Acuity ?Right Eye Distance:   ?Left Eye Distance:   ?Bilateral Distance:   ? ?Right Eye Near:   ?Left Eye Near:    ?Bilateral Near:    ? ?Physical Exam ?Constitutional:   ?   Appearance: He is well-developed.  ?HENT:  ?   Head: Normocephalic.  ?   Comments: Left-sided jaw swelling and tenderness to palpation ?   Right Ear: Tympanic membrane and ear canal normal.  ?   Left Ear: Tympanic membrane and ear canal normal.  ?   Mouth/Throat:  ?   Pharynx: Posterior oropharyngeal erythema present.  ?Cardiovascular:  ?   Rate and Rhythm: Normal rate and regular  rhythm.  ?   Heart sounds: Normal heart sounds.  ?Musculoskeletal:  ?   Cervical back: Normal range of motion and neck supple.  ?Skin: ?   General: Skin is warm and dry.  ?Neurological:  ?   General: No focal deficit present.  ?   Mental Status: He is alert and oriented to person, place, and time.  ?Psychiatric:     ?   Mood and Affect: Mood normal.     ?   Behavior: Behavior normal.  ? ? ? ?UC Treatments / Results  ?Labs ?(all labs ordered are listed, but only abnormal results are displayed) ?Labs Reviewed  ?GROUP A STREP BY PCR  ? ? ?EKG ? ? ?Radiology ?No results found. ? ?Procedures ?Procedures (including critical care time) ? ?Medications Ordered in UC ?Medications - No data to display ? ?Initial Impression / Assessment and Plan / UC Course  ?I have reviewed the triage vital signs and the nursing notes. ? ?Pertinent labs & imaging results that were available during my care of the patient were reviewed by me and considered in my medical decision making (see chart for details). ? ?Abscess of the left internal cheek ? ?Strep PCR negative, discussed findings with patient, minimal dental decay noted but prominent left cheek swelling, Augmentin 7-day course prescribed as well as ibuprofen 800 mg and viscous lidocaine for management of discomfort, Toradol injection given in office 2-day, given written handout of dental resources across the state for reoccurring symptoms, may follow-up with urgent care as needed ?Final Clinical Impressions(s) / UC Diagnoses  ? ?Final diagnoses:  ?None  ? ?Discharge Instructions   ?None ?  ? ?ED Prescriptions   ?None ?  ? ?PDMP not reviewed this encounter. ?  ? , NP ?09/02/21 1221 ? ?

## 2021-09-18 ENCOUNTER — Other Ambulatory Visit
Admission: RE | Admit: 2021-09-18 | Discharge: 2021-09-18 | Disposition: A | Discharge status: Home / Self Care | Payer: Self-pay | Attending: Nephrology | Admitting: Nephrology

## 2021-09-18 DIAGNOSIS — I10 Essential (primary) hypertension: Secondary | ICD-10-CM | POA: Insufficient documentation

## 2021-09-18 DIAGNOSIS — R3129 Other microscopic hematuria: Secondary | ICD-10-CM | POA: Insufficient documentation

## 2021-09-18 LAB — CBC WITH DIFFERENTIAL/PLATELET
Abs Immature Granulocytes: 0.04 10*3/uL (ref 0.00–0.07)
Basophils Absolute: 0.1 10*3/uL (ref 0.0–0.1)
Basophils Relative: 1 %
Eosinophils Absolute: 0.1 10*3/uL (ref 0.0–0.5)
Eosinophils Relative: 1 %
HCT: 47.3 % (ref 39.0–52.0)
Hemoglobin: 16.2 g/dL (ref 13.0–17.0)
Immature Granulocytes: 1 %
Lymphocytes Relative: 26 %
Lymphs Abs: 2.3 10*3/uL (ref 0.7–4.0)
MCH: 30.8 pg (ref 26.0–34.0)
MCHC: 34.2 g/dL (ref 30.0–36.0)
MCV: 89.9 fL (ref 80.0–100.0)
Monocytes Absolute: 0.5 10*3/uL (ref 0.1–1.0)
Monocytes Relative: 6 %
Neutro Abs: 5.7 10*3/uL (ref 1.7–7.7)
Neutrophils Relative %: 65 %
Platelets: 348 10*3/uL (ref 150–400)
RBC: 5.26 MIL/uL (ref 4.22–5.81)
RDW: 12.4 % (ref 11.5–15.5)
WBC: 8.7 10*3/uL (ref 4.0–10.5)
nRBC: 0 % (ref 0.0–0.2)

## 2021-09-18 LAB — RENAL FUNCTION PANEL
Albumin: 3.9 g/dL (ref 3.5–5.0)
Anion gap: 12 (ref 5–15)
BUN: 8 mg/dL (ref 6–20)
CO2: 23 mmol/L (ref 22–32)
Calcium: 9.3 mg/dL (ref 8.9–10.3)
Chloride: 101 mmol/L (ref 98–111)
Creatinine, Ser: 0.76 mg/dL (ref 0.61–1.24)
GFR, Estimated: >60 mL/min (ref >60)
Glucose, Bld: 162 mg/dL — ABNORMAL HIGH (ref 70–99)
Phosphorus: 3.1 mg/dL (ref 2.5–4.6)
Potassium: 3.6 mmol/L (ref 3.5–5.1)
Sodium: 136 mmol/L (ref 135–145)

## 2022-03-27 IMAGING — US US RENAL
1 series · 14 of 22 positions shown · non-contrast
Comparison: Ultrasound 06/17/2016

CLINICAL DATA: Hematuria

EXAM:
RENAL / URINARY TRACT ULTRASOUND COMPLETE

[Series 1: us renal · 0.23mm/px · 14 of 22 slices shown]
[im 1/22]
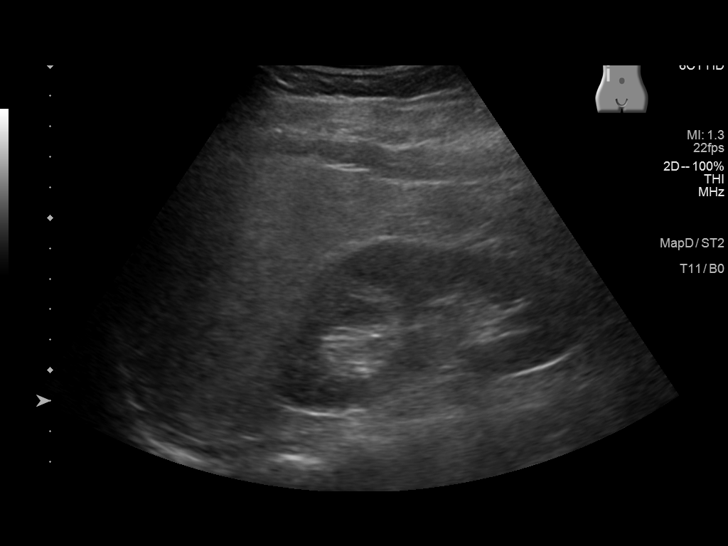
[im 3/22]
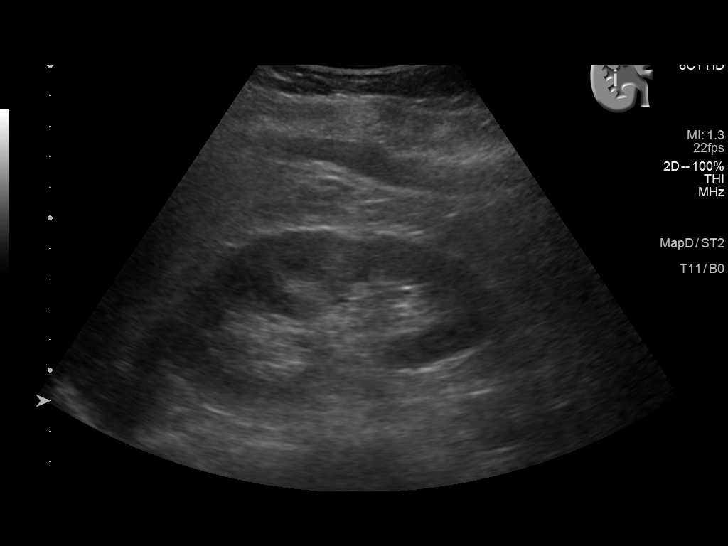
[im 4/22]
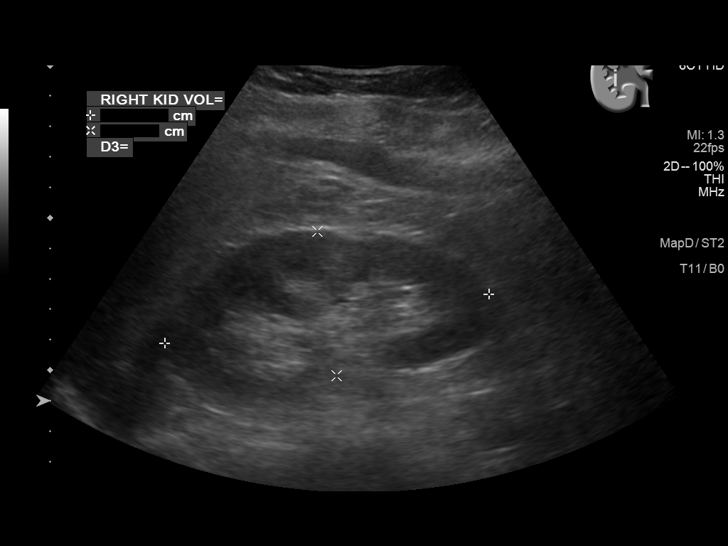
[im 6/22]
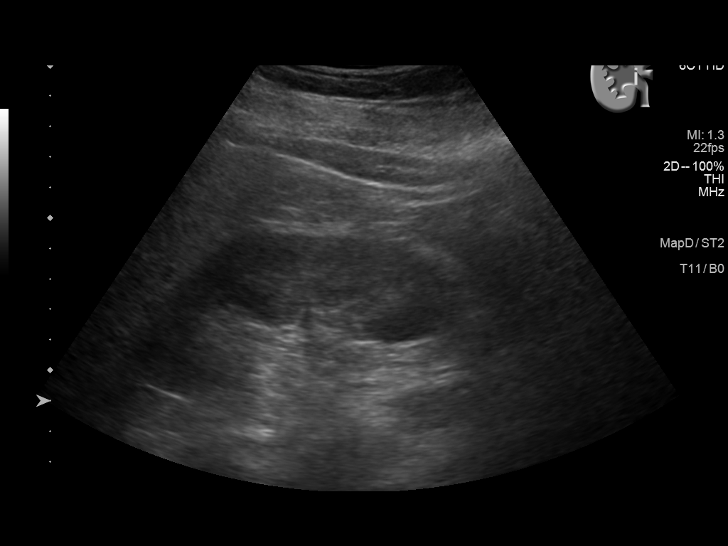
[im 8/22]
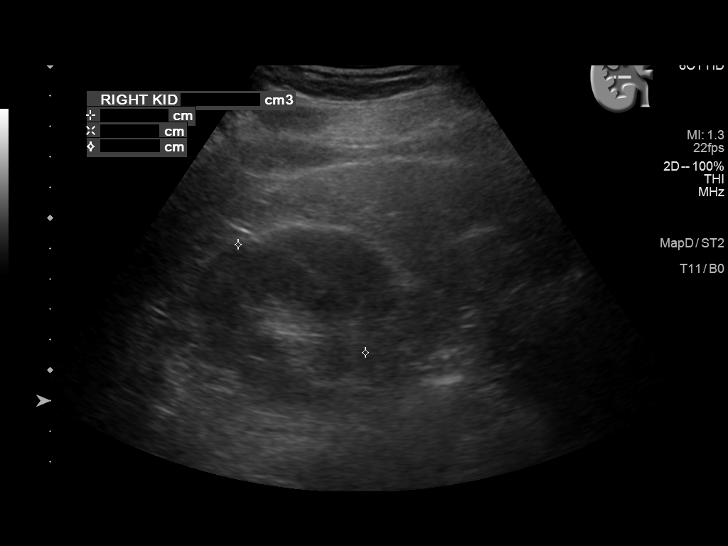
[im 9/22]
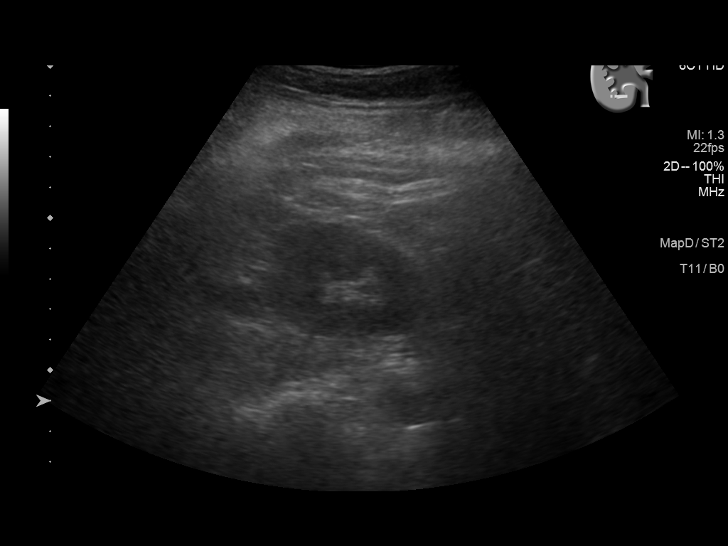
[im 11/22]
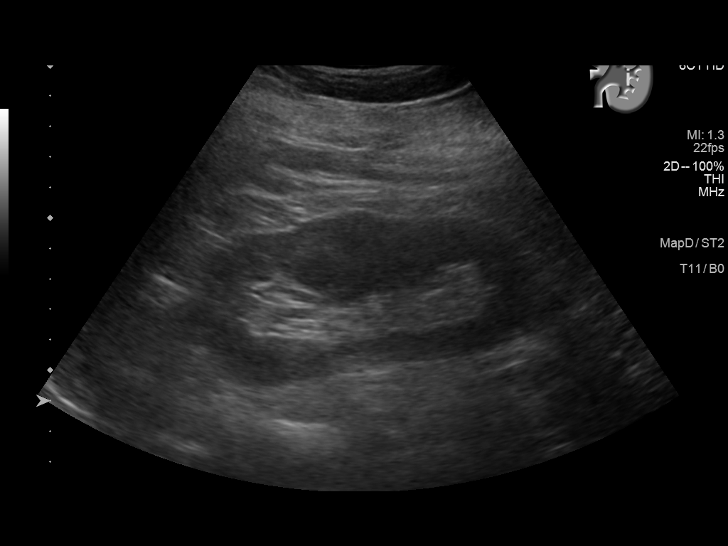
[im 12/22]
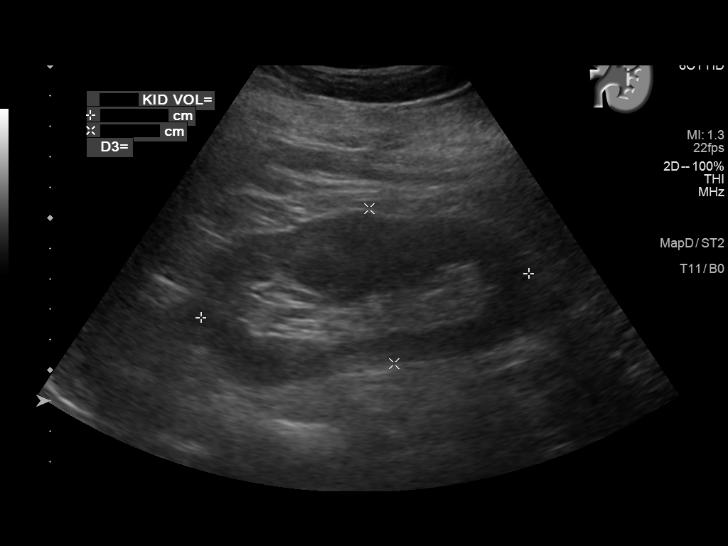
[im 14/22]
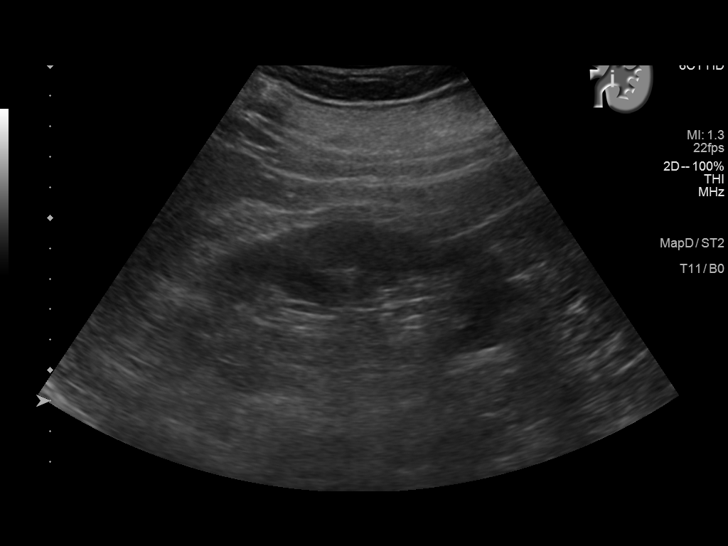
[im 15/22]
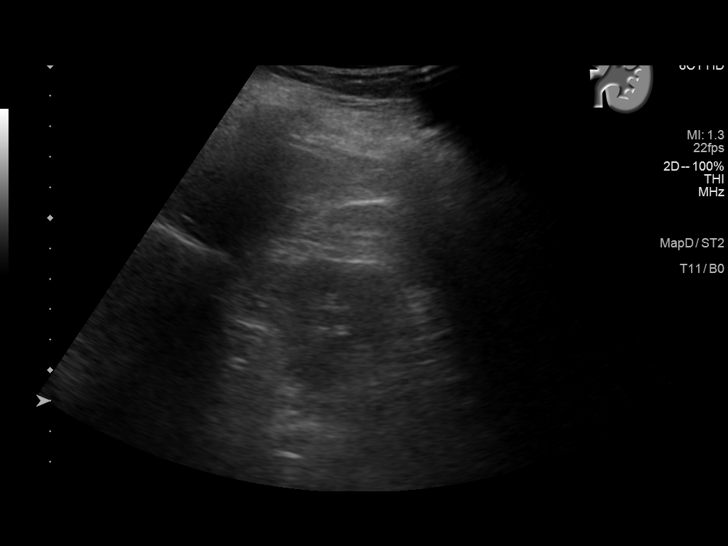
[im 17/22]
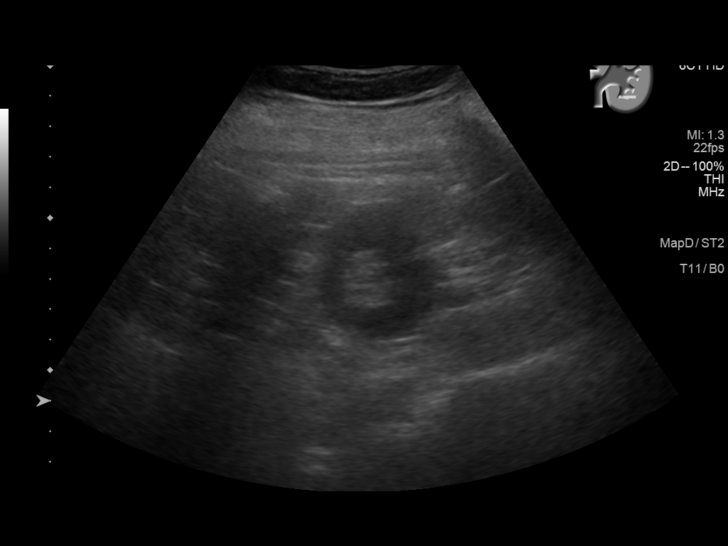
[im 19/22]
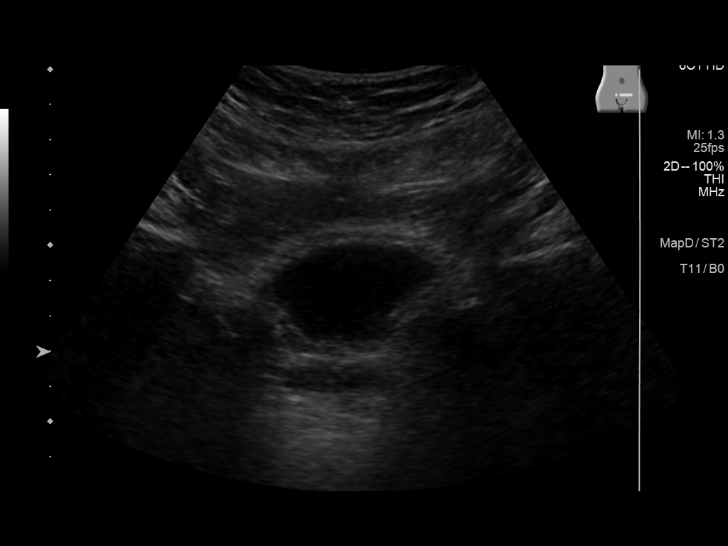
[im 20/22]
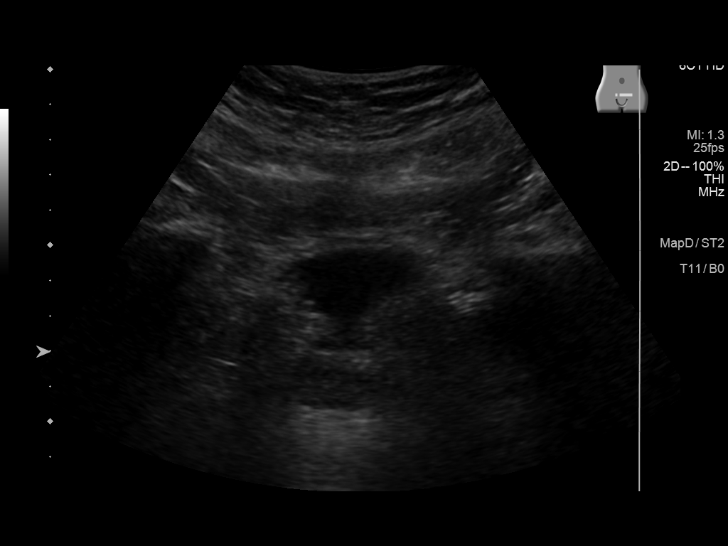
[im 22/22]
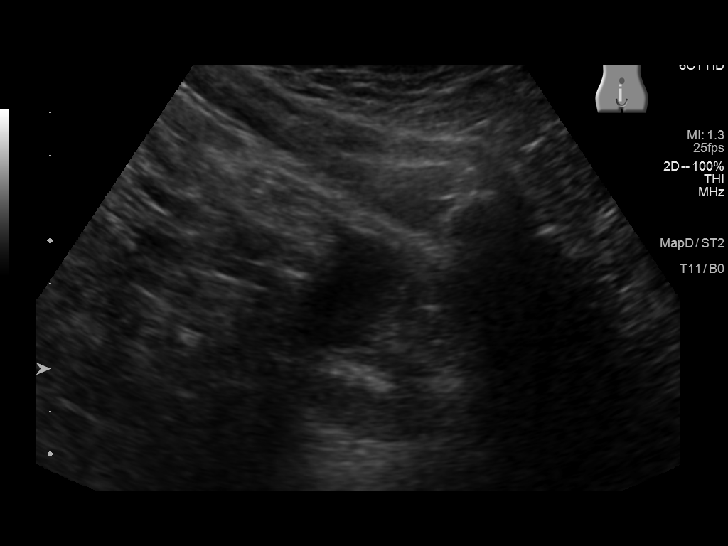

[14 of 22 positions shown; findings below may reference images not displayed]

FINDINGS: Right Kidney:

Renal measurements: 10.8 x 4.8 x 5.5 cm = volume: 148 mL.
Echogenicity within normal limits. No mass or hydronephrosis
visualized.

Left Kidney:

Renal measurements: 10.9 x 5.2 x 5 cm = volume: 146 mL. Echogenicity
within normal limits. No mass or hydronephrosis visualized.

Bladder:

Appears normal for degree of bladder distention.

Other:

None.
IMPRESSION: Negative renal ultrasound

## 2022-08-11 ENCOUNTER — Encounter: Payer: Self-pay | Admitting: Emergency Medicine

## 2022-08-11 ENCOUNTER — Ambulatory Visit
Admission: EM | Admit: 2022-08-11 | Discharge: 2022-08-11 | Disposition: A | Discharge status: Home / Self Care | Payer: Self-pay

## 2022-08-11 ENCOUNTER — Ambulatory Visit (INDEPENDENT_AMBULATORY_CARE_PROVIDER_SITE_OTHER): Payer: Self-pay

## 2022-08-11 DIAGNOSIS — M7752 Other enthesopathy of left foot: Secondary | ICD-10-CM

## 2022-08-11 MED ORDER — IBUPROFEN 600 MG PO TABS: 600.0000 mg | ORAL_TABLET | Freq: Four times a day (QID) | ORAL | 0 refills | Status: DC | PRN

## 2022-08-11 NOTE — ED Provider Notes (Signed)
[facility]    CSN: [id number] Arrival date & time: 08/11/22  [id number]      History   Chief Complaint Chief Complaint  Patient presents with   Ankle Pain    HPI Jermaine Alvarez is a 42 y.o. male.   HPI  42 year old male here for evaluation of left ankle pain.  The patient has a significant past medical history to include hypertension, anxiety, and pyloric stenosis with surgical repair and he is presenting for evaluation of several weeks of left ankle pain.  He states that he began a new job approximately 3 months ago and he has to wear heavy boots.  Over the last 2 to 3 weeks he has been experiencing pain all throughout his ankle joint.  He has a history of frequent ankle sprains when he was in high school but he denies any recent injury.  He denies any numbness or tingling in his toes.  Past Medical History:  Diagnosis Date   Anxiety    Hypertension    Pyloric stenosis     There are no problems to display for this patient.   Past Surgical History:  Procedure Laterality Date   pyloric stenosis         Home Medications    Prior to Admission medications   Medication Sig Start Date End Date Taking? Authorizing Provider  amLODipine (NORVASC) 5 MG tablet Take by mouth. 10/23/21 10/23/22 Yes [provider]  carvedilol (COREG) 6.25 MG tablet Take by mouth. 07/15/21  Yes [provider]  cloNIDine (CATAPRES) 0.1 MG tablet Take by mouth. 08/15/21  Yes [provider]  escitalopram (LEXAPRO) 20 MG tablet Take by mouth. 11/13/20  Yes [provider]  ibuprofen (ADVIL) 600 MG tablet Take 1 tablet (600 mg total) by mouth every 6 (six) hours as needed. 08/11/22  Yes Margarette Canada, NP  lisinopril (ZESTRIL) 20 MG tablet Take by mouth. 11/13/20  Yes [provider]  omeprazole (PRILOSEC) 20 MG capsule Take by mouth. 08/01/16  Yes [provider]    Family History Family History  Problem Relation Age of Onset   Other Mother         unknown medical history   Heart attack Father 44    Social History Social History   Tobacco Use   Smoking status: Every Day    Packs/day: 1.00    Years: 20.00    Total pack years: 20.00    Types: Cigarettes   Smokeless tobacco: Never  Vaping Use   Vaping Use: Never used  Substance Use Topics   Alcohol use: Yes    Alcohol/week: 14.0 standard drinks of alcohol    Types: 14 Cans of beer per week   Drug use: Not Currently    Types: Cocaine, Marijuana    Comment: pain pills, "whatever I can get"  04/03/20- last use ~3 months ago      Allergies   Patient has no known allergies.   Review of Systems Review of Systems  Constitutional:  Negative for fever.  Musculoskeletal:  Positive for arthralgias. Negative for joint swelling.  Skin:  Negative for color change.  Neurological:  Negative for weakness and numbness.     Physical Exam Triage Vital Signs ED Triage Vitals [08/11/22 0838]  Enc Vitals Group     BP      Pulse      Resp      Temp      Temp src      SpO2  Weight      Height      Head Circumference      Peak Flow      Pain Score 3     Pain Loc      Pain Edu?      Excl. in [place]?    No data found.  Updated Vital Signs BP (!) 142/91 (BP Location: Right Arm)   Pulse (!) 111   Temp 98.4 F (36.9 C) (Oral)   Resp 18   SpO2 96%   Visual Acuity Right Eye Distance:   Left Eye Distance:   Bilateral Distance:    Right Eye Near:   Left Eye Near:    Bilateral Near:     Physical Exam Vitals and nursing note reviewed.  Musculoskeletal:        General: Tenderness present. No swelling, deformity or signs of injury. Normal range of motion.     Comments: Patient has pain with palpation of the entirety of the ankle joint but there is no edema, ecchymosis, or erythema.  His DP and PT pulses are 2+.  The pain increases with resisted dorsiflexion and plantarflexion but patient exhibits good strength with both activities.  He has full range of motion.   Skin:    General: Skin is warm and dry.     Capillary Refill: Capillary refill takes less than 2 seconds.     Findings: No bruising or erythema.  Neurological:     General: No focal deficit present.     Mental Status: He is oriented to person, place, and time.     Sensory: No sensory deficit.     Motor: No weakness.      UC Treatments / Results  Labs (all labs ordered are listed, but only abnormal results are displayed) Labs Reviewed - No data to display  EKG   Radiology DG Ankle Complete Left  Result Date: 08/11/2022 CLINICAL DATA:  Pain EXAM: LEFT ANKLE COMPLETE - 3+ VIEW COMPARISON:  None Available. FINDINGS: No fracture or dislocation is seen. Small linear calcification is seen at the attachment of Achilles tendon to the calcaneus. IMPRESSION: No recent fracture or dislocation is seen in left ankle. Electronically Signed   By: Elmer Picker M.D.   On: 08/11/2022 09:10    Procedures Procedures (including critical care time)  Medications Ordered in UC Medications - No data to display  Initial Impression / Assessment and Plan / UC Course  I have reviewed the triage vital signs and the nursing notes.  Pertinent labs & imaging results that were available during my care of the patient were reviewed by me and considered in my medical decision making (see chart for details).   Patient is a nontoxic-appearing 42 year old male here for evaluation of circumferential ankle pain times several weeks as outlined in HPI above.  On exam his ankle is in normal anatomical alignment and free of ecchymosis, erythema, or edema.  He reports a history of frequent ankle sprains when he was younger playing sports so this could possibly be some arthritis.  He has a wearing heavy boots had a new job which could be contributing to some tendinopathy.  He has no bony tenderness on exam.  I will obtain a radiograph of his left ankle to rule out the presence of arthritis.  Left ankle films  independently reviewed and evaluated by me.  Impression: The the talus and motor joint is well-maintained.  There is no evidence of fracture or dislocation.  No appreciable spurring noted.  Soft tissues are unremarkable.  Radiology overread is pending. Radiology impression states no fracture or dislocation is seen..  I will discharge patient home with a diagnosis of tendinitis of his left ankle and treat him with over-the-counter NSAIDs, ice   Final Clinical Impressions(s) / UC Diagnoses   Final diagnoses:  Tendinitis of left ankle     Discharge Instructions      Your x-ray did not show any evidence of a fracture or dislocation.  I believe your pain is coming from the tendons, most likely a result of wearing your heavy boots at work.  Use the 600 mg of ibuprofen I prescribed every 6 hours with food as needed for pain and inflammation.  Ice your ankles for 20 minutes after work to help decrease inflammation.  You may want to consider purchasing an elastic compression brace to wear under your boots to provide more support.  If your symptoms continue I recommend you follow-up with podiatry for additional treatment options.     ED Prescriptions     Medication Sig Dispense Auth. Provider   ibuprofen (ADVIL) 600 MG tablet Take 1 tablet (600 mg total) by mouth every 6 (six) hours as needed. 30 tablet Margarette Canada, NP      PDMP not reviewed this encounter.   Margarette Canada, NP 08/11/22 [phone]

## 2022-08-11 NOTE — ED Triage Notes (Signed)
Pt presents with left ankle pain for several weeks. Pt denies any injury.

## 2022-08-11 NOTE — Discharge Instructions (Signed)
Your x-ray did not show any evidence of a fracture or dislocation.  I believe your pain is coming from the tendons, most likely a result of wearing your heavy boots at work.  Use the 600 mg of ibuprofen I prescribed every 6 hours with food as needed for pain and inflammation.  Ice your ankles for 20 minutes after work to help decrease inflammation.  You may want to consider purchasing an elastic compression brace to wear under your boots to provide more support.  If your symptoms continue I recommend you follow-up with podiatry for additional treatment options.

## 2022-08-14 ENCOUNTER — Ambulatory Visit
Admission: EM | Admit: 2022-08-14 | Discharge: 2022-08-14 | Disposition: A | Discharge status: Home / Self Care | Payer: Self-pay

## 2022-08-14 ENCOUNTER — Other Ambulatory Visit: Payer: Self-pay

## 2022-08-14 DIAGNOSIS — M25572 Pain in left ankle and joints of left foot: Secondary | ICD-10-CM

## 2022-08-14 NOTE — Discharge Instructions (Signed)
-  Purchase new shoes. - Continue ibuprofen or Aleve and Tylenol.  Consider also use of Voltaren gel, ice, hot Epsom salt soaks, braces and wraps.  If no improvement over the next week or symptoms worsen you will need to follow-up with orthopedics as there is not much else we can do with the urgent care.  You may also consider physical therapy.  He can go to [facility] in [place].  He do not need any sort of referral to go there.

## 2022-08-14 NOTE — ED Triage Notes (Signed)
Was seen here Monday for left ankle pain and was dc'd with pain and no fx. Pt states he took off from work again because of pain and needs a work excuse for taking off.

## 2022-08-14 NOTE — ED Provider Notes (Signed)
[facility]    CSN: [id number] Arrival date & time: 08/14/22  0848      History   Chief Complaint Chief Complaint  Patient presents with   Ankle Pain    Was seen here Monday for left ankle pain and was dc'd with pain and no fx. Pt states he took off from work again because of pain and needs a work excuse for taking off.     HPI Jermaine Alvarez is a 42 y.o. male presenting for progressively worsening ankle pain for the last several weeks.  He denies specific injury.  Reports he wears heavy boots at work and needs to buy new shoes but cannot afford to get any until his next paycheck.  Reports history of frequent ankle sprains when he was younger.  He was seen here 3 days ago and had normal x-rays of his ankle.  He was prescribed NSAIDs and advised to follow-up with podiatry.  He returns today complaining continued pain.  Has tried ibuprofen and ice with some relief.  He says his ankle pain is presently 2 out of 10.  No associated swelling, bruising, redness, lacerations, abrasions, numbness/tingling or weakness.  No history of arthritis or gout.  HPI  Past Medical History:  Diagnosis Date   Anxiety    Hypertension    Pyloric stenosis     There are no problems to display for this patient.   Past Surgical History:  Procedure Laterality Date   pyloric stenosis         Home Medications    Prior to Admission medications   Medication Sig Start Date End Date Taking? Authorizing Provider  amLODipine (NORVASC) 5 MG tablet Take by mouth. 10/23/21 10/23/22  [provider]  carvedilol (COREG) 6.25 MG tablet Take by mouth. 07/15/21   [provider]  cloNIDine (CATAPRES) 0.1 MG tablet Take by mouth. 08/15/21   [provider]  escitalopram (LEXAPRO) 20 MG tablet Take by mouth. 11/13/20   [provider]  ibuprofen (ADVIL) 600 MG tablet Take 1 tablet (600 mg total) by mouth every 6 (six) hours as needed. 08/11/22   Margarette Canada, NP   lisinopril (ZESTRIL) 20 MG tablet Take by mouth. 11/13/20   [provider]  omeprazole (PRILOSEC) 20 MG capsule Take by mouth. 08/01/16   [provider]    Family History Family History  Problem Relation Age of Onset   Other Mother        unknown medical history   Heart attack Father 79    Social History Social History   Tobacco Use   Smoking status: Every Day    Packs/day: 1.00    Years: 20.00    Total pack years: 20.00    Types: Cigarettes   Smokeless tobacco: Never  Vaping Use   Vaping Use: Never used  Substance Use Topics   Alcohol use: Yes    Alcohol/week: 14.0 standard drinks of alcohol    Types: 14 Cans of beer per week   Drug use: Not Currently    Types: Cocaine, Marijuana    Comment: pain pills, "whatever I can get"  04/03/20- last use ~3 months ago      Allergies   Patient has no known allergies.   Review of Systems Review of Systems  Musculoskeletal:  Positive for arthralgias. Negative for gait problem and joint swelling.  Skin:  Negative for color change and wound.  Neurological:  Negative for weakness and numbness.     Physical  Exam Triage Vital Signs ED Triage Vitals  Enc Vitals Group     BP      Pulse      Resp      Temp      Temp src      SpO2      Weight      Height      Head Circumference      Peak Flow      Pain Score      Pain Loc      Pain Edu?      Excl. in [place]?    No data found.  Updated Vital Signs BP (!) 146/103   Pulse 100   Temp 98.5 F (36.9 C) (Oral)   Resp 20   SpO2 95%     Physical Exam Vitals and nursing note reviewed.  Constitutional:      General: He is not in acute distress.    Appearance: Normal appearance. He is well-developed. He is not ill-appearing.  HENT:     Head: Normocephalic and atraumatic.  Eyes:     General: No scleral icterus.    Conjunctiva/sclera: Conjunctivae normal.  Cardiovascular:     Rate and Rhythm: Normal rate.     Pulses: Normal pulses.  Pulmonary:      Effort: Pulmonary effort is normal. No respiratory distress.  Musculoskeletal:     Cervical back: Neck supple.     Left ankle: No swelling, deformity, ecchymosis or lacerations. Tenderness (diffusely throughout anterior ankle) present over the medial malleolus. Normal range of motion. Normal pulse.     Left Achilles Tendon: Tenderness present. No defects. Thompson's test negative.  Skin:    General: Skin is warm and dry.     Capillary Refill: Capillary refill takes less than 2 seconds.  Neurological:     General: No focal deficit present.     Mental Status: He is alert. Mental status is at baseline.     Motor: No weakness.     Gait: Gait normal.  Psychiatric:        Mood and Affect: Mood normal.        Behavior: Behavior normal.      UC Treatments / Results  Labs (all labs ordered are listed, but only abnormal results are displayed) Labs Reviewed - No data to display  EKG   Radiology No results found.  Procedures Procedures (including critical care time)  Medications Ordered in UC Medications - No data to display  Initial Impression / Assessment and Plan / UC Course  I have reviewed the triage vital signs and the nursing notes.  Pertinent labs & imaging results that were available during my care of the patient were reviewed by me and considered in my medical decision making (see chart for details).   42 year old male presents for left ankle pain for the past several weeks.  Was seen here 3 days ago and had normal x-rays of his ankle.  Started on NSAIDs.  Has help but he needs to get new shoes.  He would like a work note since he missed work yesterday and will need to miss today as well due to discomfort.  Although, he does rate his ankle pain at about 2 out of 10 currently.  He says he plans to get new shoes today or tomorrow once he gets pain.  Reviewed previous note from a couple days ago and x-ray.  Today he has tenderness throughout the anterior ankle, Achilles tendon  and medial malleolus  but there is no associated swelling he has full range of motion.  Some discomfort with dorsiflexion and plantarflexion.  Pain likely related to the shoes that he is wearing are possible tendinitis but symptoms do not appear to be severe or debilitating.  Advised him to continue with NSAIDs, Tylenol, topical Voltaren gel, ice, Epsom salt soaks and bracing.  Advised if no improvement over the next week or he needs further time out he will need to follow-up with orthopedics as there is not much else we can do in urgent care for this condition to work it up or treat it.   Final Clinical Impressions(s) / UC Diagnoses   Final diagnoses:  Acute left ankle pain     Discharge Instructions      -Purchase new shoes. - Continue ibuprofen or Aleve and Tylenol.  Consider also use of Voltaren gel, ice, hot Epsom salt soaks, braces and wraps.  If no improvement over the next week or symptoms worsen you will need to follow-up with orthopedics as there is not much else we can do with the urgent care.  You may also consider physical therapy.  He can go to results physiotherapy in [place].  He do not need any sort of referral to go there.     ED Prescriptions   None    PDMP not reviewed this encounter.   Danton Clap, PA-C 08/14/22 [phone]

## 2022-12-17 ENCOUNTER — Other Ambulatory Visit: Payer: Self-pay | Admitting: Physical Medicine & Rehabilitation

## 2022-12-17 DIAGNOSIS — G8929 Other chronic pain: Secondary | ICD-10-CM

## 2022-12-23 ENCOUNTER — Ambulatory Visit
Admission: RE | Admit: 2022-12-23 | Discharge: 2022-12-23 | Disposition: A | Discharge status: Home / Self Care | Payer: Self-pay | Source: Ambulatory Visit | Attending: Physical Medicine & Rehabilitation | Admitting: Physical Medicine & Rehabilitation

## 2022-12-23 DIAGNOSIS — G8929 Other chronic pain: Secondary | ICD-10-CM

## 2022-12-28 ENCOUNTER — Inpatient Hospital Stay: Payer: Medicaid Other

## 2022-12-28 ENCOUNTER — Inpatient Hospital Stay
Admission: EM | Admit: 2022-12-28 | Discharge: 2022-12-31 | DRG: 917 | Disposition: A | Discharge status: Home / Self Care | Payer: Medicaid Other | Attending: Internal Medicine | Admitting: Internal Medicine

## 2022-12-28 ENCOUNTER — Emergency Department: Payer: Medicaid Other

## 2022-12-28 ENCOUNTER — Other Ambulatory Visit: Payer: Self-pay

## 2022-12-28 DIAGNOSIS — T405X1A Poisoning by cocaine, accidental (unintentional), initial encounter: Principal | ICD-10-CM | POA: Diagnosis present

## 2022-12-28 DIAGNOSIS — F419 Anxiety disorder, unspecified: Secondary | ICD-10-CM | POA: Diagnosis present

## 2022-12-28 DIAGNOSIS — T17910A Gastric contents in respiratory tract, part unspecified causing asphyxiation, initial encounter: Secondary | ICD-10-CM | POA: Diagnosis present

## 2022-12-28 DIAGNOSIS — D72829 Elevated white blood cell count, unspecified: Secondary | ICD-10-CM | POA: Diagnosis present

## 2022-12-28 DIAGNOSIS — Z79899 Other long term (current) drug therapy: Secondary | ICD-10-CM | POA: Diagnosis not present

## 2022-12-28 DIAGNOSIS — Y92009 Unspecified place in unspecified non-institutional (private) residence as the place of occurrence of the external cause: Secondary | ICD-10-CM | POA: Diagnosis not present

## 2022-12-28 DIAGNOSIS — K219 Gastro-esophageal reflux disease without esophagitis: Secondary | ICD-10-CM | POA: Diagnosis present

## 2022-12-28 DIAGNOSIS — R0789 Other chest pain: Secondary | ICD-10-CM

## 2022-12-28 DIAGNOSIS — Z8249 Family history of ischemic heart disease and other diseases of the circulatory system: Secondary | ICD-10-CM | POA: Diagnosis not present

## 2022-12-28 DIAGNOSIS — T50904A Poisoning by unspecified drugs, medicaments and biological substances, undetermined, initial encounter: Secondary | ICD-10-CM

## 2022-12-28 DIAGNOSIS — Z1152 Encounter for screening for COVID-19: Secondary | ICD-10-CM | POA: Diagnosis not present

## 2022-12-28 DIAGNOSIS — R578 Other shock: Secondary | ICD-10-CM | POA: Diagnosis present

## 2022-12-28 DIAGNOSIS — J9601 Acute respiratory failure with hypoxia: Secondary | ICD-10-CM | POA: Diagnosis present

## 2022-12-28 DIAGNOSIS — R0902 Hypoxemia: Secondary | ICD-10-CM

## 2022-12-28 DIAGNOSIS — F1110 Opioid abuse, uncomplicated: Secondary | ICD-10-CM | POA: Diagnosis present

## 2022-12-28 DIAGNOSIS — J810 Acute pulmonary edema: Secondary | ICD-10-CM | POA: Diagnosis present

## 2022-12-28 DIAGNOSIS — R000 Tachycardia, unspecified: Secondary | ICD-10-CM | POA: Diagnosis present

## 2022-12-28 DIAGNOSIS — I10 Essential (primary) hypertension: Secondary | ICD-10-CM | POA: Diagnosis present

## 2022-12-28 DIAGNOSIS — F17210 Nicotine dependence, cigarettes, uncomplicated: Secondary | ICD-10-CM | POA: Diagnosis present

## 2022-12-28 DIAGNOSIS — F1410 Cocaine abuse, uncomplicated: Secondary | ICD-10-CM | POA: Diagnosis present

## 2022-12-28 DIAGNOSIS — T40411A Poisoning by fentanyl or fentanyl analogs, accidental (unintentional), initial encounter: Secondary | ICD-10-CM | POA: Diagnosis present

## 2022-12-28 DIAGNOSIS — T17908A Unspecified foreign body in respiratory tract, part unspecified causing other injury, initial encounter: Secondary | ICD-10-CM | POA: Diagnosis not present

## 2022-12-28 DIAGNOSIS — T40601A Poisoning by unspecified narcotics, accidental (unintentional), initial encounter: Principal | ICD-10-CM

## 2022-12-28 DIAGNOSIS — J80 Acute respiratory distress syndrome: Secondary | ICD-10-CM | POA: Diagnosis present

## 2022-12-28 DIAGNOSIS — T50901A Poisoning by unspecified drugs, medicaments and biological substances, accidental (unintentional), initial encounter: Secondary | ICD-10-CM | POA: Diagnosis not present

## 2022-12-28 DIAGNOSIS — F1910 Other psychoactive substance abuse, uncomplicated: Secondary | ICD-10-CM | POA: Diagnosis not present

## 2022-12-28 HISTORY — DX: Gastro-esophageal reflux disease without esophagitis: K21.9

## 2022-12-28 LAB — COMPREHENSIVE METABOLIC PANEL
ALT: 34 U/L (ref 0–44)
AST: 38 U/L (ref 15–41)
Albumin: 4.1 g/dL (ref 3.5–5.0)
Alkaline Phosphatase: 78 U/L (ref 38–126)
Anion gap: 11 (ref 5–15)
BUN: 18 mg/dL (ref 6–20)
CO2: 24 mmol/L (ref 22–32)
Calcium: 10.3 mg/dL (ref 8.9–10.3)
Chloride: 99 mmol/L (ref 98–111)
Creatinine, Ser: 1.2 mg/dL (ref 0.61–1.24)
GFR, Estimated: >60 mL/min (ref >60)
Glucose, Bld: 172 mg/dL — ABNORMAL HIGH (ref 70–99)
Potassium: 3.7 mmol/L (ref 3.5–5.1)
Sodium: 134 mmol/L — ABNORMAL LOW (ref 135–145)
Total Bilirubin: 0.8 mg/dL (ref 0.3–1.2)
Total Protein: 7.8 g/dL (ref 6.5–8.1)

## 2022-12-28 LAB — RESPIRATORY PANEL BY PCR

## 2022-12-28 LAB — CBC WITH DIFFERENTIAL/PLATELET
Abs Immature Granulocytes: 0.32 10*3/uL — ABNORMAL HIGH (ref 0.00–0.07)
Basophils Absolute: 0.1 10*3/uL (ref 0.0–0.1)
Basophils Relative: 1 %
Eosinophils Absolute: 0 10*3/uL (ref 0.0–0.5)
Eosinophils Relative: 0 %
HCT: 44.9 % (ref 39.0–52.0)
Hemoglobin: 15.4 g/dL (ref 13.0–17.0)
Immature Granulocytes: 2 %
Lymphocytes Relative: 12 %
Lymphs Abs: 2.2 10*3/uL (ref 0.7–4.0)
MCH: 29.8 pg (ref 26.0–34.0)
MCHC: 34.3 g/dL (ref 30.0–36.0)
MCV: 86.8 fL (ref 80.0–100.0)
Monocytes Absolute: 0.6 10*3/uL (ref 0.1–1.0)
Monocytes Relative: 3 %
Neutro Abs: 15.9 10*3/uL — ABNORMAL HIGH (ref 1.7–7.7)
Neutrophils Relative %: 82 %
Platelets: 277 10*3/uL (ref 150–400)
RBC: 5.17 MIL/uL (ref 4.22–5.81)
RDW: 12 % (ref 11.5–15.5)
WBC: 19.1 10*3/uL — ABNORMAL HIGH (ref 4.0–10.5)
nRBC: 0 % (ref 0.0–0.2)

## 2022-12-28 LAB — MRSA NEXT GEN BY PCR, NASAL: MRSA by PCR Next Gen: NOT DETECTED

## 2022-12-28 LAB — BLOOD GAS, ARTERIAL
Acid-base deficit: 3.1 mmol/L — ABNORMAL HIGH (ref 0.0–2.0)
Bicarbonate: 24 mmol/L (ref 20.0–28.0)
O2 Content: 6 L/min
O2 Saturation: 90 %
Patient temperature: 37
pCO2 arterial: 50 mmHg — ABNORMAL HIGH (ref 32–48)
pH, Arterial: 7.29 — ABNORMAL LOW (ref 7.35–7.45)
pO2, Arterial: 61 mmHg — ABNORMAL LOW (ref 83–108)

## 2022-12-28 LAB — ACETAMINOPHEN LEVEL: Acetaminophen (Tylenol), Serum: <10 ug/mL — ABNORMAL LOW (ref 10–30)

## 2022-12-28 LAB — URINE DRUG SCREEN, QUALITATIVE (ARMC ONLY)
Amphetamines, Ur Screen: NOT DETECTED
Barbiturates, Ur Screen: NOT DETECTED
Benzodiazepine, Ur Scrn: NOT DETECTED
Cannabinoid 50 Ng, Ur ~~LOC~~: POSITIVE — AB
Cocaine Metabolite,Ur ~~LOC~~: POSITIVE — AB
MDMA (Ecstasy)Ur Screen: NOT DETECTED
Methadone Scn, Ur: NOT DETECTED
Opiate, Ur Screen: NOT DETECTED
Phencyclidine (PCP) Ur S: NOT DETECTED
Tricyclic, Ur Screen: POSITIVE — AB

## 2022-12-28 LAB — BRAIN NATRIURETIC PEPTIDE: B Natriuretic Peptide: 25.6 pg/mL (ref 0.0–100.0)

## 2022-12-28 LAB — HIV ANTIBODY (ROUTINE TESTING W REFLEX): HIV Screen 4th Generation wRfx: NONREACTIVE

## 2022-12-28 LAB — GLUCOSE, CAPILLARY
Glucose-Capillary: 108 mg/dL — ABNORMAL HIGH (ref 70–99)
Glucose-Capillary: 91 mg/dL (ref 70–99)

## 2022-12-28 LAB — ETHANOL: Alcohol, Ethyl (B): <10 mg/dL (ref <10)

## 2022-12-28 LAB — SALICYLATE LEVEL: Salicylate Lvl: <7 mg/dL — ABNORMAL LOW (ref 7.0–30.0)

## 2022-12-28 LAB — TROPONIN I (HIGH SENSITIVITY)
Troponin I (High Sensitivity): 9 ng/L (ref <18)
Troponin I (High Sensitivity): 16 ng/L (ref <18)

## 2022-12-28 LAB — SARS CORONAVIRUS 2 BY RT PCR: SARS Coronavirus 2 by RT PCR: NEGATIVE

## 2022-12-28 MED ORDER — KETOROLAC TROMETHAMINE 30 MG/ML IJ SOLN
15.0000 mg | Freq: Three times a day (TID) | INTRAMUSCULAR | Status: DC | PRN
  Administered 2022-12-29 – 2022-12-31 (×6): 15 mg via INTRAVENOUS
  Filled 2022-12-28 (×7): qty 1

## 2022-12-28 MED ORDER — ONDANSETRON HCL 4 MG PO TABS: 4.0000 mg | ORAL_TABLET | Freq: Four times a day (QID) | ORAL | Status: DC | PRN

## 2022-12-28 MED ORDER — NALOXONE HCL 2 MG/2ML IJ SOSY: 0.4000 mg | PREFILLED_SYRINGE | INTRAMUSCULAR | Status: DC | PRN

## 2022-12-28 MED ORDER — ACETAMINOPHEN 325 MG PO TABS
650.0000 mg | ORAL_TABLET | Freq: Four times a day (QID) | ORAL | Status: DC | PRN
  Administered 2022-12-28: 650 mg via ORAL
  Filled 2022-12-28: qty 2

## 2022-12-28 MED ORDER — LEVALBUTEROL HCL 0.63 MG/3ML IN NEBU
0.6300 mg | INHALATION_SOLUTION | Freq: Four times a day (QID) | RESPIRATORY_TRACT | Status: DC
  Administered 2022-12-28 – 2022-12-29 (×3): 0.63 mg via RESPIRATORY_TRACT
  Filled 2022-12-28 (×4): qty 3

## 2022-12-28 MED ORDER — ENOXAPARIN SODIUM 40 MG/0.4ML IJ SOSY
40.0000 mg | PREFILLED_SYRINGE | INTRAMUSCULAR | Status: DC
  Administered 2022-12-28 – 2022-12-30 (×2): 40 mg via SUBCUTANEOUS
  Filled 2022-12-28 (×2): qty 0.4

## 2022-12-28 MED ORDER — ONDANSETRON HCL 4 MG/2ML IJ SOLN: 4.0000 mg | Freq: Four times a day (QID) | INTRAMUSCULAR | Status: DC | PRN

## 2022-12-28 MED ORDER — ONDANSETRON HCL 4 MG/2ML IJ SOLN
4.0000 mg | Freq: Once | INTRAMUSCULAR | Status: AC
  Administered 2022-12-28: 4 mg via INTRAVENOUS
  Filled 2022-12-28: qty 2

## 2022-12-28 MED ORDER — METHYLPREDNISOLONE SODIUM SUCC 40 MG IJ SOLR
40.0000 mg | Freq: Two times a day (BID) | INTRAMUSCULAR | Status: DC
  Administered 2022-12-28 – 2022-12-31 (×6): 40 mg via INTRAVENOUS
  Filled 2022-12-28 (×6): qty 1

## 2022-12-28 MED ORDER — SODIUM CHLORIDE 0.9 % IV SOLN
3.0000 g | Freq: Four times a day (QID) | INTRAVENOUS | Status: DC
  Administered 2022-12-28 – 2022-12-31 (×11): 3 g via INTRAVENOUS
  Filled 2022-12-28 (×12): qty 8

## 2022-12-28 MED ORDER — ALUM & MAG HYDROXIDE-SIMETH 200-200-20 MG/5ML PO SUSP
30.0000 mL | ORAL | Status: DC | PRN
  Administered 2022-12-28 – 2022-12-29 (×2): 30 mL via ORAL
  Filled 2022-12-28 (×2): qty 30

## 2022-12-28 MED ORDER — KETOROLAC TROMETHAMINE 60 MG/2ML IM SOLN: 15.0000 mg | Freq: Once | INTRAMUSCULAR | Status: DC

## 2022-12-28 MED ORDER — LEVALBUTEROL HCL 0.63 MG/3ML IN NEBU
0.6300 mg | INHALATION_SOLUTION | Freq: Four times a day (QID) | RESPIRATORY_TRACT | Status: DC | PRN
  Administered 2022-12-28: 0.63 mg via RESPIRATORY_TRACT
  Filled 2022-12-28: qty 3

## 2022-12-28 MED ORDER — INSULIN ASPART 100 UNIT/ML IJ SOLN: 0.0000 [IU] | INTRAMUSCULAR | Status: DC

## 2022-12-28 MED ORDER — CHLORHEXIDINE GLUCONATE CLOTH 2 % EX PADS
6.0000 | MEDICATED_PAD | Freq: Every day | CUTANEOUS | Status: DC
  Administered 2022-12-28 – 2022-12-31 (×3): 6 via TOPICAL

## 2022-12-28 MED ORDER — ACETAMINOPHEN 500 MG PO TABS
1000.0000 mg | ORAL_TABLET | Freq: Once | ORAL | Status: DC
  Filled 2022-12-28: qty 2

## 2022-12-28 MED ORDER — IOHEXOL 350 MG/ML SOLN
75.0000 mL | Freq: Once | INTRAVENOUS | Status: AC | PRN
  Administered 2022-12-28: 75 mL via INTRAVENOUS

## 2022-12-28 MED ORDER — GUAIFENESIN-DM 100-10 MG/5ML PO SYRP
5.0000 mL | ORAL_SOLUTION | ORAL | Status: DC | PRN
  Administered 2022-12-29 – 2022-12-31 (×5): 5 mL via ORAL
  Filled 2022-12-28 (×6): qty 10

## 2022-12-28 MED ORDER — KETOROLAC TROMETHAMINE 15 MG/ML IJ SOLN
INTRAMUSCULAR | Status: AC
  Administered 2022-12-28: 15 mg
  Filled 2022-12-28: qty 1

## 2022-12-28 MED ORDER — SODIUM CHLORIDE 0.9 % IV SOLN: INTRAVENOUS | Status: DC

## 2022-12-28 MED ORDER — SODIUM CHLORIDE 0.9 % IV BOLUS
1000.0000 mL | Freq: Once | INTRAVENOUS | Status: AC
  Administered 2022-12-28: 1000 mL via INTRAVENOUS

## 2022-12-28 MED ORDER — ORAL CARE MOUTH RINSE: 15.0000 mL | OROMUCOSAL | Status: DC | PRN

## 2022-12-28 NOTE — ED Notes (Signed)
RT at bedside to eval pt and draw ABG.

## 2022-12-28 NOTE — Assessment & Plan Note (Addendum)
New O2 requirement up to requiring wearing a nonrebreather at the bedside with concern for aspiration event associated with drug overdose, later he was placed on heated high flow.  CT chest with concern of bilateral opacities, pulmonary edema versus ARDS. PCCM is on board. -Oxygen requirement improved to 2 L. -Stop IV fluid -Continue to monitor -Continue with Unasyn for concern of aspiration with unresponsiveness

## 2022-12-28 NOTE — ED Notes (Addendum)
Pt to the side of bed for use of urinal. voided at this time.

## 2022-12-28 NOTE — ED Notes (Signed)
Pt not doing any worse on 3L [place]. Sleeping intermittently. Wakes to voice.

## 2022-12-28 NOTE — ED Notes (Signed)
Pt was desaturating to 80% on 3L. Pulse ox is correctly positioned on earlobe with good pleth. Turned oxygen to 6L, still was satting 84-88%. Pt placed back on NRB at 15L. Pt has loud expiratory wheezing, audible without stethoscope. Pt appears labored specially with expiration. Hospitalist notified.

## 2022-12-28 NOTE — Assessment & Plan Note (Signed)
BP stable Titrate home regimen 

## 2022-12-28 NOTE — ED Notes (Signed)
ICU NP at bedside to consult pt.

## 2022-12-28 NOTE — Assessment & Plan Note (Signed)
Patient self admits to cocaine and fentanyl use Mother reports longstanding history of substance abuse in the past Noted drug overdose in the setting of cocaine and fentanyl use Urine drug screen pending Discussed cessation at length with the patient

## 2022-12-28 NOTE — ED Triage Notes (Signed)
  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

## 2022-12-28 NOTE — ED Notes (Signed)
Pt is alert and continues to cough continuously with brown sputum. Family is at bedside.

## 2022-12-28 NOTE — Progress Notes (Signed)
       CROSS COVER NOTE  NAME: EMILLIO NGO MRN: [id number] DOB : 1981-03-22    Concern as stated by nurse / staff   Could I get a diet order for this patient? He has a productive cough, but is clearing secretions, and passed the yale swallow test with no coughing or trouble swallowing.      Pertinent findings on chart review: Pulmonology note reviewed: Acute Hypoxic Respiratory Failure -context of opiod overdose with global hypoxemia pulmonary vasoconstriction and subsequent development of florid pulmonary edema -supplemental O2 -despite pulmonary edema patient is hypotensive and tachyarrythmic -currently on IVF due to cirulatory shock with MAP goal >65 -less likely but possible ARDS -overlying aspiration is likely - treat with unasyn for now  Assessment and  Interventions   Assessment:  Patient has suspected aspiration   Plan: Keep diet as is tonight . Will leave decision to primary attending in the am  X X

## 2022-12-28 NOTE — Progress Notes (Signed)
Patient continues to remove HHFNC with saturation dropping into 70s. Requiring frequent reminders to keep HHF in place. Patient AOX4. Saturation recovers to 91% within 1-2 minutes after maintaining HHF in place appropriately.

## 2022-12-28 NOTE — Consult Note (Signed)
CRITICAL CARE     Name: Jermaine Alvarez MRN: [id number] DOB: 10-Oct-1980     LOS: 0   SUBJECTIVE FINDINGS & SIGNIFICANT EVENTS    History of Presenting Illness:  42 year old male with a history of hypertension, hepatitis C, polysubstance abuse, apparently was using cocaine and fentanyl was found unresponsive by family.  He required CPR which was also done by family and then 911 was called.  He received Narcan in the field.  Upon my evaluation patient is awake and able to verbally communicate.  He is vomiting during my interview and evaluation.  Patient denies pain during evaluation.  He is found to have borderline hypotension and hypoxemia. He had CT chest imaging performed with chest findings include There is no pleural effusion or pneumothorax.  Extensive coalescing nodular alveolar opacities have developed throughout both lungs during the less than 2 hour interval from the  prior chest radiographs. These have a perihilar distribution, not typical for aspiration. Blood work with marked leukocytosis consistent with SIRS.  PCCM consultation for acute hypoxemic respiratory failure with transient hypotension.   Lines/tubes :   Microbiology/Sepsis markers: Results for orders placed or performed during the hospital encounter of 09/02/21  Group A Strep by PCR     Status: None   Collection Time: 09/02/21 11:25 AM   Specimen: Throat; Sterile Swab  Result Value Ref Range Status   Group A Strep by PCR NOT DETECTED NOT DETECTED Final    Comment: Performed at [facility], [address], [place], [place] [address]    Anti-infectives:  Anti-infectives (From admission, onward)    None        Consults: Treatment Team:  Vida Rigger, MD    PAST MEDICAL HISTORY   Past Medical History:   Diagnosis Date   Anxiety    GERD (gastroesophageal reflux disease)    Hypertension    Pyloric stenosis      SURGICAL HISTORY   Past Surgical History:  Procedure Laterality Date   pyloric stenosis       FAMILY HISTORY   Family History  Problem Relation Age of Onset   Other Mother        unknown medical history   Heart attack Father 62     SOCIAL HISTORY   Social History   Tobacco Use   Smoking status: Every Day    Current packs/day: 1.00    Average packs/day: 1 pack/day for 20.0 years (20.0 ttl pk-yrs)    Types: Cigarettes   Smokeless tobacco: Never  Vaping Use   Vaping status: Never Used  Substance Use Topics   Alcohol use: Yes    Alcohol/week: 14.0 standard drinks of alcohol    Types: 14 Cans of beer per week   Drug use: Yes    Types: Cocaine, Marijuana    Comment: snorted fentanyl and cocaine 12/28/22/pain pills, "whatever I can get"  04/03/20- last use ~3 months ago     MEDICATIONS   Current Medication:  Current Facility-Administered Medications:    0.9 %  sodium chloride infusion, , Intravenous, Continuous, Floydene Flock, MD, Last Rate: 100 mL/hr at 12/28/22 1218, New Bag at 12/28/22 1218   acetaminophen (TYLENOL) tablet 1,000 mg, 1,000 mg, Oral, Once, Pilar Jarvis, MD   enoxaparin (LOVENOX) injection 40 mg, 40 mg, Subcutaneous, Q24H, Floydene Flock, MD, 40 mg at 12/28/22 1204   ketorolac (TORADOL) injection 15 mg, 15 mg, Intramuscular, Once, Floydene Flock, MD   levalbuterol Pauline Aus) nebulizer solution 0.63  mg, 0.63 mg, Nebulization, Q6H PRN, Floydene Flock, MD, 0.63 mg at 12/28/22 1248   naloxone [facility]) injection 0.4 mg, 0.4 mg, Intravenous, PRN, Pilar Jarvis, MD   ondansetron [facility]) tablet 4 mg, 4 mg, Oral, Q6H PRN **OR** ondansetron (ZOFRAN) injection 4 mg, 4 mg, Intravenous, Q6H PRN, Floydene Flock, MD  Current Outpatient Medications:    amLODipine (NORVASC) 5 MG tablet, Take by mouth., Disp: , Rfl:    carvedilol (COREG) 6.25 MG  tablet, Take by mouth., Disp: , Rfl:    cloNIDine (CATAPRES) 0.1 MG tablet, Take by mouth., Disp: , Rfl:    escitalopram (LEXAPRO) 20 MG tablet, Take by mouth., Disp: , Rfl:    ibuprofen (ADVIL) 600 MG tablet, Take 1 tablet (600 mg total) by mouth every 6 (six) hours as needed., Disp: 30 tablet, Rfl: 0   lisinopril (ZESTRIL) 20 MG tablet, Take by mouth., Disp: , Rfl:    omeprazole (PRILOSEC) 20 MG capsule, Take by mouth., Disp: , Rfl:     ALLERGIES   Patient has no known allergies.    REVIEW OF SYSTEMS    10 point ROS done and is as per HPI except for vomiting and nausea.   PHYSICAL EXAMINATION   Vital Signs: Temp:  [97.6 F (36.4 C)] 97.6 F (36.4 C) (07/21 1244) Pulse Rate:  [107-122] 116 (07/21 1330) Resp:  [19-27] 25 (07/21 1330) BP: (126)/(84) 126/84 (07/21 1049) SpO2:  [89 %-100 %] 93 % (07/21 1330) Weight:  [88.5 kg] 88.5 kg (07/21 1050)  GENERAL:Age appropriate and + distress HEAD: Normocephalic, atraumatic.  EYES: Pupils equal, round, reactive to light.  No scleral icterus.  MOUTH: Moist mucosal membrane. NECK: Supple. No thyromegaly. No nodules. No JVD.  PULMONARY: wheezing/crackles CARDIOVASCULAR: S1 and S2. Regular rate and rhythm. No murmurs, rubs, or gallops.  GASTROINTESTINAL: Soft, nontender, non-distended. No masses. Positive bowel sounds. No hepatosplenomegaly.  MUSCULOSKELETAL: No swelling, clubbing, or edema.  NEUROLOGIC: Mild distress due to acute illness SKIN:intact,warm,dry   PERTINENT DATA     Infusions:  sodium chloride 100 mL/hr at 12/28/22 1218   Scheduled Medications:  acetaminophen  1,000 mg Oral Once   enoxaparin (LOVENOX) injection  40 mg Subcutaneous Q24H   ketorolac  15 mg Intramuscular Once   PRN Medications: levalbuterol, naLOXone (NARCAN)  injection, ondansetron **OR** ondansetron (ZOFRAN) IV Hemodynamic parameters:   Intake/Output: No intake/output data recorded.  Ventilator  Settings:     LAB RESULTS:  Basic  Metabolic Panel: Recent Labs  Lab 12/28/22 1054  NA 134*  K 3.7  CL 99  CO2 24  GLUCOSE 172*  BUN 18  CREATININE 1.20  CALCIUM 10.3   Liver Function Tests: Recent Labs  Lab 12/28/22 1054  AST 38  ALT 34  ALKPHOS 78  BILITOT 0.8  PROT 7.8  ALBUMIN 4.1   No results for input(s): "LIPASE", "AMYLASE" in the last 168 hours. No results for input(s): "AMMONIA" in the last 168 hours. CBC: Recent Labs  Lab 12/28/22 1054  WBC 19.1*  NEUTROABS 15.9*  HGB 15.4  HCT 44.9  MCV 86.8  PLT 277   Cardiac Enzymes: No results for input(s): "CKTOTAL", "CKMB", "CKMBINDEX", "TROPONINI" in the last 168 hours. BNP: Invalid input(s): "POCBNP" CBG: No results for input(s): "GLUCAP" in the last 168 hours.     IMAGING RESULTS:   Narrative & Impression  CLINICAL DATA:  Found down. Post CPR. Evaluate for acute pulmonary embolism.   EXAM: CT ANGIOGRAPHY CHEST WITH CONTRAST   TECHNIQUE: Multidetector CT imaging  of the chest was performed using the standard protocol during bolus administration of intravenous contrast. Multiplanar CT image reconstructions and MIPs were obtained to evaluate the vascular anatomy.   RADIATION DOSE REDUCTION: This exam was performed according to the departmental dose-optimization program which includes automated exposure control, adjustment of the mA and/or kV according to patient size and/or use of iterative reconstruction technique.   CONTRAST:  75mL OMNIPAQUE IOHEXOL 350 MG/ML SOLN   COMPARISON:  Chest radiographs earlier the same date and 04/03/2020   FINDINGS: Cardiovascular: The pulmonary arteries are well opacified with contrast to the level of the subsegmental branches. There is no evidence of acute pulmonary embolism. The systemic arteries appear normal. The heart size is normal. There is no pericardial effusion.   Mediastinum/Nodes: Prominent mediastinal and hilar lymph nodes, including an AP window node measuring 10 mm on image 56/4  and an 8 mm subcarinal node on image 68/4. No axillary adenopathy. The thyroid gland, trachea and esophagus demonstrate no significant findings.   Lungs/Pleura: There is no pleural effusion or pneumothorax. Extensive coalescing nodular alveolar opacities have developed throughout both lungs during the less than 2 hour interval from the prior chest radiographs. These have a perihilar distribution, not typical for aspiration.   Upper abdomen:  The visualized upper abdomen appears unremarkable.   Musculoskeletal/Chest wall: No acute fractures are identified. There are mild degenerative changes in the spine. Bilateral gynecomastia noted.   Unless specific follow-up recommendations are mentioned in the findings or impression sections, no imaging follow-up of any mentioned incidental findings is recommended.   Review of the MIP images confirms the above findings.   IMPRESSION: 1. No evidence of acute pulmonary embolism or other acute vascular findings in the chest. 2. Rapid development of extensive coalescing nodular alveolar opacities throughout both lungs since earlier chest radiographs, most consistent with acute edema/ARDS. This could be secondary to aspiration, although the distribution is atypical. Acute pulmonary hemorrhage could have this appearance. Rapid development not typical for infection. 3. Prominent mediastinal and hilar lymph nodes, likely reactive. 4. Bilateral gynecomastia.     Electronically Signed   By: Carey Bullocks M.D.   On: 12/28/2022 12:48       ASSESSMENT AND PLAN    -Multidisciplinary rounds held today  Acute Hypoxic Respiratory Failure -context of opiod overdose with global hypoxemia pulmonary vasoconstriction and subsequent development of florid pulmonary edema -supplemental O2 -despite pulmonary edema patient is hypotensive and tachyarrythmic -currently on IVF due to cirulatory shock with MAP goal >65 -less likely but possible  ARDS -overlying aspiration is likely - treat with unasyn for now  Circulatory shock   Due to drug overdose with fentanyl - present on admission  -supplemental O2 ICU monitoring   ID -continue IV abx as prescibed -follow up cultures  GI/Nutrition GI PROPHYLAXIS as indicated DIET-->TF's as tolerated Constipation protocol as indicated  ENDO - ICU hypoglycemic\Hyperglycemia protocol -check FSBS per protocol   ELECTROLYTES -follow labs as needed -replace as needed -pharmacy consultation   DVT/GI PRX ordered -SCDs  TRANSFUSIONS AS NEEDED MONITOR FSBS ASSESS the need for LABS as needed    Critical care provider statement:   Total critical care time: 33 minutes   Performed by: Karna Christmas MD   Critical care time was exclusive of separately billable procedures and treating other patients.   Critical care was necessary to treat or prevent imminent or life-threatening deterioration.   Critical care was time spent personally by me on the following activities: development of treatment plan with patient  and/or surrogate as well as nursing, discussions with consultants, evaluation of patient's response to treatment, examination of patient, obtaining history from patient or surrogate, ordering and performing treatments and interventions, ordering and review of laboratory studies, ordering and review of radiographic studies, pulse oximetry and re-evaluation of patient's condition.    Vida Rigger, M.D.  Pulmonary & Critical Care Medicine

## 2022-12-28 NOTE — ED Notes (Signed)
Moved SPO2 sensor to ear.

## 2022-12-28 NOTE — Progress Notes (Signed)
Per nursing patient with increased work of breathing at the bedside. CTA chest concerning for ARDS I have notified on-call PCCM to evaluate the patient Stat ABG DuoNebs RT to assess patient-BiPAP in the interim Follow closely

## 2022-12-28 NOTE — ED Notes (Signed)
Spoke with Dr Alvester Morin. Stat ABG was ordered and RT is on way to come assess pt.

## 2022-12-28 NOTE — Plan of Care (Signed)
  Problem: Education: Goal: Knowledge of General Education information will improve Description Including pain rating scale, medication(s)/side effects and non-pharmacologic comfort measures Outcome: Progressing   

## 2022-12-28 NOTE — Assessment & Plan Note (Signed)
Patient found unresponsive at home with shallow breathing in the setting of cocaine and fentanyl use.  CPR was done by a family member Status post multiple rounds of Narcan Urine drug screen positive for tricyclic, cocaine and cannabinoid. Per patient no suicidal intention -Continue to monitor

## 2022-12-28 NOTE — H&P (Signed)
History and Physical    Patient: Jermaine Alvarez DOB: 05-14-1981 DOA: 12/28/2022 DOS: the patient was seen and examined on 12/28/2022 PCP: Jerl Mina, MD  Patient coming from: Home  Chief Complaint:  Chief Complaint  Patient presents with   post CPR   Drug Overdose   HPI: Jermaine Alvarez is a 42 y.o. male with medical history significant of polysubstance abuse, hypertension, GERD presenting with drug overdose, acute respiratory failure with hypoxia.  Per report, patient took excessive amounts of cocaine and fentanyl at home.  Patient was found unresponsive by his family.  Patient had?  Agonal breathing.  CPR was done by family at the bedside.  EMS subsequently called with patient be receiving nasal Narcan x 2.  There was some improvement in mentation.  Family also with concern of possible vomiting aspiration event associated with being found unresponsive.  Patient states that he was just trying to get high.  Mother reports longstanding history of substance abuse in the past.  Patient denies any alcohol or IV drug use.  Positive tobacco use. Presented to the ER afebrile, heart rate 100s, respirations into the 20s, on nonrebreather 15 L/min satting around 90%. Review of Systems: As mentioned in the history of present illness. All other systems reviewed and are negative. Past Medical History:  Diagnosis Date   Anxiety    GERD (gastroesophageal reflux disease)    Hypertension    Pyloric stenosis    Past Surgical History:  Procedure Laterality Date   pyloric stenosis     Social History:  reports that he has been smoking cigarettes. He has a 20 pack-year smoking history. He has never used smokeless tobacco. He reports current alcohol use of about 14.0 standard drinks of alcohol per week. He reports current drug use. Drugs: Cocaine and Marijuana.  No Known Allergies  Family History  Problem Relation Age of Onset   Other Mother        unknown medical history    Heart attack Father 73    Prior to Admission medications   Medication Sig Start Date End Date Taking? Authorizing Provider  amLODipine (NORVASC) 5 MG tablet Take by mouth. 10/23/21 10/23/22  [provider]  carvedilol (COREG) 6.25 MG tablet Take by mouth. 07/15/21   [provider]  cloNIDine (CATAPRES) 0.1 MG tablet Take by mouth. 08/15/21   [provider]  escitalopram (LEXAPRO) 20 MG tablet Take by mouth. 11/13/20   [provider]  ibuprofen (ADVIL) 600 MG tablet Take 1 tablet (600 mg total) by mouth every 6 (six) hours as needed. 08/11/22   Becky Augusta, NP  lisinopril (ZESTRIL) 20 MG tablet Take by mouth. 11/13/20   [provider]  omeprazole (PRILOSEC) 20 MG capsule Take by mouth. 08/01/16   [provider]    Physical Exam: Vitals:   12/28/22 1300 12/28/22 1304 12/28/22 1305 12/28/22 1306  BP:      Pulse: (!) 120 (!) 120 (!) 122 (!) 119  Resp: 19 (!) 26 (!) 21 (!) 25  Temp:      TempSrc:      SpO2: 98% 95% 95% 95%  Weight:      Height:       Physical Exam Constitutional:      General: He is in acute distress.     Appearance: He is normal weight.     Comments: Positive mild to moderate increased work of breathing Significant coughing   HENT:     Head: Normocephalic and  atraumatic.     Nose: Nose normal.     Mouth/Throat:     Mouth: Mucous membranes are moist.  Eyes:     Pupils: Pupils are equal, round, and reactive to light.  Cardiovascular:     Rate and Rhythm: Normal rate and regular rhythm.  Pulmonary:     Comments: Positive increased work of breathing Trace Rales Abdominal:     General: Bowel sounds are normal.  Musculoskeletal:        General: Normal range of motion.  Skin:    General: Skin is warm.  Neurological:     General: No focal deficit present.  Psychiatric:        Mood and Affect: Mood normal.     Data Reviewed:  There are no new results to review at this time. CT Angio Chest Pulmonary  Embolism (PE) W or WO Contrast CLINICAL DATA:  Found down. Post CPR. Evaluate for acute pulmonary embolism.  EXAM: CT ANGIOGRAPHY CHEST WITH CONTRAST  TECHNIQUE: Multidetector CT imaging of the chest was performed using the standard protocol during bolus administration of intravenous contrast. Multiplanar CT image reconstructions and MIPs were obtained to evaluate the vascular anatomy.  RADIATION DOSE REDUCTION: This exam was performed according to the departmental dose-optimization program which includes automated exposure control, adjustment of the mA and/or kV according to patient size and/or use of iterative reconstruction technique.  CONTRAST:  75mL OMNIPAQUE IOHEXOL 350 MG/ML SOLN  COMPARISON:  Chest radiographs earlier the same date and 04/03/2020  FINDINGS: Cardiovascular: The pulmonary arteries are well opacified with contrast to the level of the subsegmental branches. There is no evidence of acute pulmonary embolism. The systemic arteries appear normal. The heart size is normal. There is no pericardial effusion.  Mediastinum/Nodes: Prominent mediastinal and hilar lymph nodes, including an AP window node measuring 10 mm on image 56/4 and an 8 mm subcarinal node on image 68/4. No axillary adenopathy. The thyroid gland, trachea and esophagus demonstrate no significant findings.  Lungs/Pleura: There is no pleural effusion or pneumothorax. Extensive coalescing nodular alveolar opacities have developed throughout both lungs during the less than 2 hour interval from the prior chest radiographs. These have a perihilar distribution, not typical for aspiration.  Upper abdomen:  The visualized upper abdomen appears unremarkable.  Musculoskeletal/Chest wall: No acute fractures are identified. There are mild degenerative changes in the spine. Bilateral gynecomastia noted.  Unless specific follow-up recommendations are mentioned in the findings or impression sections, no  imaging follow-up of any mentioned incidental findings is recommended.  Review of the MIP images confirms the above findings.  IMPRESSION: 1. No evidence of acute pulmonary embolism or other acute vascular findings in the chest. 2. Rapid development of extensive coalescing nodular alveolar opacities throughout both lungs since earlier chest radiographs, most consistent with acute edema/ARDS. This could be secondary to aspiration, although the distribution is atypical. Acute pulmonary hemorrhage could have this appearance. Rapid development not typical for infection. 3. Prominent mediastinal and hilar lymph nodes, likely reactive. 4. Bilateral gynecomastia.  Electronically Signed   By: Carey Bullocks M.D.   On: 12/28/2022 12:48 DG Chest Port 1 View CLINICAL DATA:  Narcan reversible, cough vomiting and hypoxia concern for aspiration.  EXAM: PORTABLE CHEST 1 VIEW  COMPARISON:  None Available.  FINDINGS: The heart size and mediastinal contours are within normal limits. Both lungs are clear. The visualized skeletal structures are unremarkable.  IMPRESSION: No active disease.  Electronically Signed   By: Larose Hires D.O.   On:  12/28/2022 11:15  Lab Results  Component Value Date   WBC 19.1 (H) 12/28/2022   HGB 15.4 12/28/2022   HCT 44.9 12/28/2022   MCV 86.8 12/28/2022   PLT 277 12/28/2022   Last metabolic panel Lab Results  Component Value Date   GLUCOSE 172 (H) 12/28/2022   NA 134 (L) 12/28/2022   K 3.7 12/28/2022   CL 99 12/28/2022   CO2 24 12/28/2022   BUN 18 12/28/2022   CREATININE 1.20 12/28/2022   GFRNONAA >60 12/28/2022   CALCIUM 10.3 12/28/2022   PHOS 3.1 09/18/2021   PROT 7.8 12/28/2022   ALBUMIN 4.1 12/28/2022   BILITOT 0.8 12/28/2022   ALKPHOS 78 12/28/2022   AST 38 12/28/2022   ALT 34 12/28/2022   ANIONGAP 11 12/28/2022    Assessment and Plan: * Drug overdose Patient found unresponsive at home with shallow breathing in the setting of  cocaine and fentanyl use Status post multiple rounds of Narcan Urine drug screen pending Will place on withdrawal protocol Discussed substance abuse cessation at length with the patient Follow closely  Hypertension BP stable Titrate home regimen  Polysubstance abuse (HCC) Patient self admits to cocaine and fentanyl use Mother reports longstanding history of substance abuse in the past Noted drug overdose in the setting of cocaine and fentanyl use Urine drug screen pending Discussed cessation at length with the patient  Acute respiratory failure with hypoxia (HCC) New O2 requirement up to requiring wearing a nonrebreather at the bedside with concern for aspiration event associated with drug overdose Chest rate initially clear White count 19 However, given degree of tachypnea and work of breathing at the bedside, will check CT of the chest to better assess As needed DuoNebs ABG x 1 Low threshold for pulmonary evaluation if respiratory status worsens      Advance Care Planning:   Code Status: Full Code   Consults: None   Family Communication: Family at the bedside   Severity of Illness: The appropriate patient status for this patient is INPATIENT. Inpatient status is judged to be reasonable and necessary in order to provide the required intensity of service to ensure the patient's safety. The patient's presenting symptoms, physical exam findings, and initial radiographic and laboratory data in the context of their chronic comorbidities is felt to place them at high risk for further clinical deterioration. Furthermore, it is not anticipated that the patient will be medically stable for discharge from the hospital within 2 midnights of admission.   * I certify that at the point of admission it is my clinical judgment that the patient will require inpatient hospital care spanning beyond 2 midnights from the point of admission due to high intensity of service, high risk for further  deterioration and high frequency of surveillance required.*  Author: Floydene Flock, MD 12/28/2022 1:07 PM  For on call review [email or web address].

## 2022-12-28 NOTE — ED Provider Notes (Signed)
[facility] Provider Note    None    (approximate)   History   post CPR and Drug Overdose   HPI  Jermaine Alvarez is a 42 y.o. male   Past medical history of hypertension, substance use, anxiety, GERD who presents to the emergency department with Narcan revival.  He states that he woke up to use the shower and then proceeded to use cocaine and fentanyl to get high.  His family heard a thud came into his room and found him unconscious with agonal respirations called fire department.  Fire department administered 2 mg of intranasal Narcan and did CPR for approximately 4 thrusts, EMS arrived and gave another 2 of IV Narcan and he awoke.  They noted that he was hypoxemic and had vomited en route, and had rhonchi and was placed on 6 L nasal cannula with improvement of his oxygen saturation.  He complains of sternal chest pain after CPR.  When I speak with the patient he states that his intention was not self-harm or suicide but instead to get high.  He reports no other drug use or alcohol use aside from his cocaine and fentanyl today.  He reports that he has otherwise been in his regular state of health and has not been experiencing any illnesses recently and has not used drugs in "a while"  His only complaint now is shortness of breath, nausea, and sternal pain.  Independent Historian contributed to assessment above: EMS provides information given above       Physical Exam   Triage Vital Signs: ED Triage Vitals  Encounter Vitals Group     BP      Systolic BP Percentile      Diastolic BP Percentile      Pulse      Resp      Temp      Temp src      SpO2      Weight      Height      Head Circumference      Peak Flow      Pain Score      Pain Loc      Pain Education      Exclude from Growth Chart     Most recent vital signs: Vitals:   12/28/22 1053 12/28/22 1055  BP:    Pulse: (!) 113   Resp: (!) 21   SpO2: (!) 89% 97%     General: Awake, no distress.  CV:  Good peripheral perfusion.  Resp:  Tachypnea.  Hypoxemia Abd:  No distention.  Other:  Rhonchi to bilateral lung fields, worse at the bases worse on the left side.  Soft nontender abdomen, anterior chest wall tenderness without obvious deformities.  No crepitus to palpation.  Pupils midrange and reactive.  Vomit around his mouth.   ED Results / Procedures / Treatments   Labs (all labs ordered are listed, but only abnormal results are displayed) Labs Reviewed  ACETAMINOPHEN LEVEL  COMPREHENSIVE METABOLIC PANEL  ETHANOL  SALICYLATE LEVEL  CBC WITH DIFFERENTIAL/PLATELET  TROPONIN I (HIGH SENSITIVITY)     I ordered and reviewed the above labs they are notable for leukocytosis white blood cell count 19  EKG  ED ECG REPORT I, Pilar Jarvis, the attending physician, personally viewed and interpreted this ECG.   Date: 12/28/2022  EKG Time: 1046  Rate: 109  Rhythm: sinus tachycardia  Axis: nl  Intervals:none  ST&T Change:  no stemi  RADIOLOGY I independently reviewed and interpreted chest x-ray and see no obvious focality or pneumothorax or fractures   PROCEDURES:  Critical Care performed: Yes, see critical care procedure note(s)  .Critical Care  Performed by: Pilar Jarvis, MD Authorized by: Pilar Jarvis, MD   Critical care provider statement:    Critical care time (minutes):  30   Critical care was time spent personally by me on the following activities:  Development of treatment plan with patient or surrogate, discussions with consultants, evaluation of patient's response to treatment, examination of patient, ordering and review of laboratory studies, ordering and review of radiographic studies, ordering and performing treatments and interventions, pulse oximetry, re-evaluation of patient's condition and review of old charts    MEDICATIONS ORDERED IN ED: Medications  naloxone (NARCAN) injection 0.4 mg (has no administration in  time range)  sodium chloride 0.9 % bolus 1,000 mL (has no administration in time range)  ondansetron (ZOFRAN) injection 4 mg (has no administration in time range)  acetaminophen (TYLENOL) tablet 1,000 mg (0 mg Oral Hold 12/28/22 1058)    External physician / consultants:  I spoke with hospitalist for admission and regarding care plan for this patient.   IMPRESSION / MDM / ASSESSMENT AND PLAN / ED COURSE  I reviewed the triage vital signs and the nursing notes.                                Patient's presentation is most consistent with acute presentation with potential threat to life or bodily function.  Differential diagnosis includes, but is not limited to, opioid overdose, rib fracture/sternal fracture, pneumothorax, aspiration pneumonitis, pneumonia   The patient is on the cardiac monitor to evaluate for evidence of arrhythmia and/or significant heart rate changes.  MDM:    Opioid overdose-status post Narcan revival awake, will readminister as needed.  Observation on cardiopulmonary monitoring.  Chest pain-after CPR, check chest x-ray for sternal fracture rib fracture or pneumothorax.  Hypoxemia-after vomiting likely aspiration with rhonchi, keep on O2, increased respiratory support as needed, defer antibiotics now given time course since aspiration event consistent with pneumonitis/inflammation rather than infection at this point.  Feels subjectively better oxygen saturation is 97% on nonrebreather at this time.  No focality noted on chest x-ray, no pneumothorax or osseous abnormalities.  Continues to cough up brown phlegm.  Admission.         FINAL CLINICAL IMPRESSION(S) / ED DIAGNOSES   Final diagnoses:  Opiate overdose, accidental or unintentional, initial encounter (HCC)  Aspiration into airway, initial encounter  Hypoxemia  Chest wall pain     Rx / DC Orders   ED Discharge Orders     None        Note:  This document was prepared using Dragon voice  recognition software and may include unintentional dictation errors.    Pilar Jarvis, MD 12/28/22 [phone]

## 2022-12-28 NOTE — Consult Note (Signed)
Pharmacy Antibiotic Note  Jermaine Alvarez is a 42 y.o. male admitted on 12/28/2022 with aspiration pneumonia.  Pharmacy has been consulted for Unasyn dosing.  Plan: Start Unasyn 3 grams IV every 6 hours Follow renal function for adjustments  Height: 5\' 10"  (177.8 cm) Weight: 88.5 kg (195 lb) IBW/kg (Calculated) : 73  Temp (24hrs), Avg:97.6 F (36.4 C), Min:97.6 F (36.4 C), Max:97.6 F (36.4 C)  Recent Labs  Lab 12/28/22 1054  WBC 19.1*  CREATININE 1.20    Estimated Creatinine Clearance: 92.6 mL/min (by C-G formula based on SCr of 1.2 mg/dL).    No Known Allergies  Antimicrobials this admission: Unasyn 7/21>>  Dose adjustments this admission: N/A  Microbiology results: N/A  Thank you for allowing pharmacy to be a part of this patient's care.  Barrie Folk, PharmD 12/28/2022 4:08 PM

## 2022-12-29 LAB — BASIC METABOLIC PANEL
Anion gap: 8 (ref 5–15)
BUN: 18 mg/dL (ref 6–20)
CO2: 28 mmol/L (ref 22–32)
Calcium: 9 mg/dL (ref 8.9–10.3)
Chloride: 101 mmol/L (ref 98–111)
Creatinine, Ser: 0.82 mg/dL (ref 0.61–1.24)
GFR, Estimated: >60 mL/min (ref >60)
Glucose, Bld: 147 mg/dL — ABNORMAL HIGH (ref 70–99)
Potassium: 4.1 mmol/L (ref 3.5–5.1)
Sodium: 137 mmol/L (ref 135–145)

## 2022-12-29 LAB — CBC
HCT: 37.3 % — ABNORMAL LOW (ref 39.0–52.0)
Hemoglobin: 12.6 g/dL — ABNORMAL LOW (ref 13.0–17.0)
MCH: 29.4 pg (ref 26.0–34.0)
MCHC: 33.8 g/dL (ref 30.0–36.0)
MCV: 86.9 fL (ref 80.0–100.0)
Platelets: 228 10*3/uL (ref 150–400)
RBC: 4.29 MIL/uL (ref 4.22–5.81)
RDW: 12.3 % (ref 11.5–15.5)
WBC: 16.7 10*3/uL — ABNORMAL HIGH (ref 4.0–10.5)
nRBC: 0 % (ref 0.0–0.2)

## 2022-12-29 LAB — COMPREHENSIVE METABOLIC PANEL
ALT: 28 U/L (ref 0–44)
AST: 40 U/L (ref 15–41)
Albumin: 3.5 g/dL (ref 3.5–5.0)
Alkaline Phosphatase: 46 U/L (ref 38–126)
Anion gap: 8 (ref 5–15)
BUN: 17 mg/dL (ref 6–20)
CO2: 24 mmol/L (ref 22–32)
Calcium: 8.8 mg/dL — ABNORMAL LOW (ref 8.9–10.3)
Chloride: 104 mmol/L (ref 98–111)
Creatinine, Ser: 0.77 mg/dL (ref 0.61–1.24)
GFR, Estimated: >60 mL/min (ref >60)
Glucose, Bld: 118 mg/dL — ABNORMAL HIGH (ref 70–99)
Potassium: 5 mmol/L (ref 3.5–5.1)
Sodium: 136 mmol/L (ref 135–145)
Total Bilirubin: 1 mg/dL (ref 0.3–1.2)
Total Protein: 6.6 g/dL (ref 6.5–8.1)

## 2022-12-29 LAB — GLUCOSE, CAPILLARY
Glucose-Capillary: 87 mg/dL (ref 70–99)
Glucose-Capillary: 117 mg/dL — ABNORMAL HIGH (ref 70–99)
Glucose-Capillary: 108 mg/dL — ABNORMAL HIGH (ref 70–99)

## 2022-12-29 MED ORDER — TRAMADOL HCL 50 MG PO TABS
50.0000 mg | ORAL_TABLET | Freq: Two times a day (BID) | ORAL | Status: DC | PRN
  Administered 2022-12-29 – 2022-12-30 (×3): 50 mg via ORAL
  Filled 2022-12-29 (×3): qty 1

## 2022-12-29 MED ORDER — ESCITALOPRAM OXALATE 20 MG PO TABS
20.0000 mg | ORAL_TABLET | Freq: Every day | ORAL | Status: DC
  Administered 2022-12-29 – 2022-12-31 (×3): 20 mg via ORAL
  Filled 2022-12-29 (×3): qty 1

## 2022-12-29 MED ORDER — PANTOPRAZOLE SODIUM 40 MG PO TBEC
40.0000 mg | DELAYED_RELEASE_TABLET | Freq: Every day | ORAL | Status: DC
  Administered 2022-12-29 – 2022-12-31 (×3): 40 mg via ORAL
  Filled 2022-12-29 (×3): qty 1

## 2022-12-29 MED ORDER — CARVEDILOL 6.25 MG PO TABS
6.2500 mg | ORAL_TABLET | Freq: Every day | ORAL | Status: DC
  Administered 2022-12-29 – 2022-12-31 (×3): 6.25 mg via ORAL
  Filled 2022-12-29 (×3): qty 1

## 2022-12-29 MED ORDER — LORAZEPAM 2 MG/ML IJ SOLN
1.0000 mg | Freq: Four times a day (QID) | INTRAMUSCULAR | Status: DC | PRN
  Administered 2022-12-29: 1 mg via INTRAVENOUS
  Filled 2022-12-29: qty 1

## 2022-12-29 NOTE — Plan of Care (Signed)
  Problem: Education: Goal: Knowledge of General Education information will improve Description: Including pain rating scale, medication(s)/side effects and non-pharmacologic comfort measures 12/29/2022 0745 by Malachi Bonds, RN Outcome: Progressing 12/28/2022 1745 by Malachi Bonds, RN Outcome: Progressing   Problem: Health Behavior/Discharge Planning: Goal: Ability to manage health-related needs will improve 12/29/2022 0745 by Malachi Bonds, RN Outcome: Progressing 12/28/2022 1745 by Malachi Bonds, RN Outcome: Not Progressing   Problem: Clinical Measurements: Goal: Will remain free from infection Outcome: Progressing Goal: Diagnostic test results will improve Outcome: Progressing Goal: Respiratory complications will improve Outcome: Progressing   Problem: Coping: Goal: Level of anxiety will decrease Outcome: Progressing   Problem: Pain Managment: Goal: General experience of comfort will improve Outcome: Progressing   Problem: Safety: Goal: Ability to remain free from injury will improve Outcome: Progressing

## 2022-12-29 NOTE — Progress Notes (Signed)
Progress Note   Patient: Jermaine Alvarez:[id number] DOB: 1980-11-25 DOA: 12/28/2022     1 DOS: the patient was seen and examined on 12/29/2022   Brief hospital course: Taken from H&P and prior notes.   Jermaine Alvarez is a 42 y.o. male with medical history significant of polysubstance abuse, hypertension, GERD presenting with drug overdose, acute respiratory failure with hypoxia.  Per report, patient took excessive amounts of cocaine and fentanyl at home.  Patient was found unresponsive by his family.  Patient had?  Agonal breathing.  CPR was done by family at the bedside.  EMS subsequently called with patient be receiving nasal Narcan x 2.  There was some improvement in mentation.  Family also with concern of possible vomiting aspiration event associated with being found unresponsive.  Patient states that he was just trying to get high.  Mother reports longstanding history of substance abuse in the past.  Patient denies any alcohol or IV drug use.  Positive tobacco use.   When presented to ED patient has tachycardia and tachypnea, was on nonrebreather. Labs pertinent for significant leukocytosis at 19.1, toxicology screen was negative for salicylates, acetaminophen, ethanol.  UDS positive for cocaine, tricyclic and cannabinoid. Chest x-ray was without any significant abnormality CTA was negative for PE but did show rapid development of extensive coalescing nodular alveolar opacities throughout the both lungs, most consistent with acute edema/ARDS.  Aspiration can be a possibility but less likely due to the distribution.  Another differential of acute pulmonary hemorrhage per radiology report.  Patient was admitted in ICU.  Later placed on heated high flow.  7/22: Remained mildly tachycardic, improving leukocytosis.  Placed on Unasyn for concern of aspiration.  Able to wean back to regular nasal cannula from heated high flow.  Denies any suicidal idea.  IV fluid  discontinued.      Assessment and Plan: * Drug overdose Patient found unresponsive at home with shallow breathing in the setting of cocaine and fentanyl use.  CPR was done by a family member Status post multiple rounds of Narcan Urine drug screen positive for tricyclic, cocaine and cannabinoid. Per patient no suicidal intention -Continue to monitor  Acute respiratory failure with hypoxia (HCC) New O2 requirement up to requiring wearing a nonrebreather at the bedside with concern for aspiration event associated with drug overdose, later he was placed on heated high flow.  CT chest with concern of bilateral opacities, pulmonary edema versus ARDS. PCCM is on board. -Oxygen requirement improved to 2 L. -Stop IV fluid -Continue to monitor -Continue with Unasyn for concern of aspiration with unresponsiveness  Hypertension BP stable Titrate home regimen  Polysubstance abuse (HCC) Patient self admits to cocaine and fentanyl use Mother reports longstanding history of substance abuse in the past Noted drug overdose in the setting of cocaine and fentanyl use Urine drug screen pending Discussed cessation at length with the patient   Subjective: Patient was seen and examined today.  No new concern.  Denies any suicidal thoughts.  Per patient he will quit taking illicit drugs.  Physical Exam: Vitals:   12/29/22 1400 12/29/22 1500 12/29/22 1600 12/29/22 1705  BP: 116/64 (!) 105/50 133/82 117/75  Pulse: (!) 120 (!) 118 (!) 120 (!) 118  Resp: (!) 25 (!) 24 (!) 28 (!) 27  Temp:      TempSrc:      SpO2: 95% 93% 93% 98%  Weight:      Height:       General.  Well-developed gentleman,  in no acute distress. Pulmonary.  Lungs clear bilaterally, normal respiratory effort. CV.  Regular rate and rhythm, no JVD, rub or murmur. Abdomen.  Soft, nontender, nondistended, BS positive. CNS.  Alert and oriented .  No focal neurologic deficit. Extremities.  No edema, no cyanosis, pulses intact and  symmetrical. Psychiatry.  Judgment and insight appears normal.   Data Reviewed: Prior data reviewed  Family Communication: Discussed with patient  Disposition: Status is: Inpatient Remains inpatient appropriate because: Severity of illness  Planned Discharge Destination: Home  DVT prophylaxis.  Lovenox Time spent: 45 minutes  This record has been created using Conservation officer, historic buildings. Errors have been sought and corrected,but may not always be located. Such creation errors do not reflect on the standard of care.   Author: Arnetha Courser, MD 12/29/2022 5:55 PM  For on call review [email or web address].

## 2022-12-29 NOTE — TOC Initial Note (Signed)
Transition of Care [facility]) - Initial/Assessment Note    Patient Details  Name: Jermaine Alvarez MRN: [id number] Date of Birth: 09/14/1980  Transition of Care [facility]) CM/SW Contact:    Kreg Shropshire, RN Phone Number: 12/29/2022, 1:00 PM  Clinical Narrative:                 Cm assessed for substance abuse and toc needs. Pt stated he lives with family. He does not have insurance because he stated he does not have a proper job with insurance. He has a PCP and he understands he states "I usually pay out of pocket when I go to the doctor."  Cm asked about substance abuse resources. He declined any substance abuse resources at this time. Cm will continue to follow for toc needs.    Barriers to Discharge: Continued Medical Work up   Patient Goals and CMS Choice            Expected Discharge Plan and Services                                              Prior Living Arrangements/Services                       Activities of Daily Living      Permission Sought/Granted                  Emotional Assessment   Attitude/Demeanor/Rapport: Engaged Affect (typically observed): Calm Orientation: : Oriented to Self, Oriented to Place, Oriented to  Time, Oriented to Situation Alcohol / Substance Use: Illicit Drugs    Admission diagnosis:  Hypoxemia [R09.02] Chest wall pain [R07.89] Drug overdose [T50.901A] Opiate overdose, accidental or unintentional, initial encounter (HCC) [T40.601A] Aspiration into airway, initial encounter [T17.908A] Patient Active Problem List   Diagnosis Date Noted   Drug overdose 12/28/2022   Acute respiratory failure with hypoxia (HCC) 12/28/2022   Polysubstance abuse (HCC) 12/28/2022   Hypertension 12/28/2022   PCP:  Jerl Mina, MD Pharmacy:   [facility] [phone] - Closed - [place], [place] - [address] [place] [address] Phone: [phone] Fax: [phone]  [facility] [id number]  [place], [place] - [address] [place] [address] Phone: [phone] Fax: [phone]     Social Determinants of Health (SDOH) Social History: SDOH Screenings   Tobacco Use: High Risk (12/28/2022)   SDOH Interventions:     Readmission Risk Interventions     No data to display

## 2022-12-29 NOTE — Progress Notes (Addendum)
Pt complains of pain in chest s/t compressions when coughing. MD placed new order for Tramadol PRN. Administered to patient along with home medications ordered by MD and PRN cough medication. Right AC PIV removed d/t occlusion. Ordered Antibiotic administering via left FA PIV. Vitals WNL. Patient resting in bed comfortably with all needs met at this time. Call light and personal belongings within patient's reach. Dinner ordered by patient. Will continue to monitor.All questions answered within nursing scope of practice regarding administered medications and plan of care.

## 2022-12-29 NOTE — Hospital Course (Addendum)
Taken from H&P and prior notes.   Jermaine Alvarez is a 42 y.o. male with medical history significant of polysubstance abuse, hypertension, GERD presenting with drug overdose, acute respiratory failure with hypoxia.  Per report, patient took excessive amounts of cocaine and fentanyl at home.  Patient was found unresponsive by his family.  Patient had?  Agonal breathing.  CPR was done by family at the bedside.  EMS subsequently called with patient be receiving nasal Narcan x 2.  There was some improvement in mentation.  Family also with concern of possible vomiting aspiration event associated with being found unresponsive.  Patient states that he was just trying to get high.  Mother reports longstanding history of substance abuse in the past.  Patient denies any alcohol or IV drug use.  Positive tobacco use.   When presented to ED patient has tachycardia and tachypnea, was on nonrebreather. Labs pertinent for significant leukocytosis at 19.1, toxicology screen was negative for salicylates, acetaminophen, ethanol.  UDS positive for cocaine, tricyclic and cannabinoid. Chest x-ray was without any significant abnormality CTA was negative for PE but did show rapid development of extensive coalescing nodular alveolar opacities throughout the both lungs, most consistent with acute edema/ARDS.  Aspiration can be a possibility but less likely due to the distribution.  Another differential of acute pulmonary hemorrhage per radiology report.  Patient was admitted in ICU.  Later placed on heated high flow.  7/22: Remained mildly tachycardic, improving leukocytosis.  Placed on Unasyn for concern of aspiration.  Able to wean back to regular nasal cannula from heated high flow.  Denies any suicidal idea.  IV fluid discontinued.  7/23: Patient still on 2 to 4 L of oxygen.  Repeat chest x-ray with increasing bilateral opacities.  Giving 1 dose of IV Lasix and adding bronchodilator.  7/23: Hemodynamically stable.   Worsening leukocytosis but patient was on Solu-Medrol.  Restarting home lisinopril, Lexapro and Effexor.  Patient was also started on Zithromax for atypical coverage.  Discussed with pulmonology and they will follow him up as outpatient to see the resolution of findings from chest imaging.  Patient is being discharged on 4 more days of Augmentin, Zithromax and prednisone.  He was also given Combivent inhaler to use as needed for wheezing.  Patient received 2 doses of IV Lasix during current hospitalization.  Patient will continue his home medications on discharge and need to have a close follow-up with his providers for further recommendations.

## 2022-12-30 ENCOUNTER — Inpatient Hospital Stay: Payer: Medicaid Other

## 2022-12-30 LAB — CBC
HCT: 36.9 % — ABNORMAL LOW (ref 39.0–52.0)
Hemoglobin: 12.6 g/dL — ABNORMAL LOW (ref 13.0–17.0)
MCH: 30 pg (ref 26.0–34.0)
MCHC: 34.1 g/dL (ref 30.0–36.0)
MCV: 87.9 fL (ref 80.0–100.0)
Platelets: 233 10*3/uL (ref 150–400)
RBC: 4.2 MIL/uL — ABNORMAL LOW (ref 4.22–5.81)
RDW: 12.4 % (ref 11.5–15.5)
WBC: 17.9 10*3/uL — ABNORMAL HIGH (ref 4.0–10.5)
nRBC: 0 % (ref 0.0–0.2)

## 2022-12-30 LAB — BASIC METABOLIC PANEL
Anion gap: 7 (ref 5–15)
BUN: 26 mg/dL — ABNORMAL HIGH (ref 6–20)
CO2: 26 mmol/L (ref 22–32)
Calcium: 9.2 mg/dL (ref 8.9–10.3)
Chloride: 103 mmol/L (ref 98–111)
Creatinine, Ser: 0.88 mg/dL (ref 0.61–1.24)
GFR, Estimated: >60 mL/min (ref >60)
Glucose, Bld: 148 mg/dL — ABNORMAL HIGH (ref 70–99)
Potassium: 4.8 mmol/L (ref 3.5–5.1)
Sodium: 136 mmol/L (ref 135–145)

## 2022-12-30 LAB — HEMOGLOBIN A1C
Hgb A1c MFr Bld: 6.2 % — ABNORMAL HIGH (ref 4.8–5.6)
Mean Plasma Glucose: 131 mg/dL

## 2022-12-30 MED ORDER — ACETAMINOPHEN 325 MG PO TABS
650.0000 mg | ORAL_TABLET | Freq: Four times a day (QID) | ORAL | Status: DC | PRN
  Administered 2022-12-30 – 2022-12-31 (×2): 650 mg via ORAL
  Filled 2022-12-30 (×2): qty 2

## 2022-12-30 MED ORDER — IPRATROPIUM-ALBUTEROL 0.5-2.5 (3) MG/3ML IN SOLN
3.0000 mL | RESPIRATORY_TRACT | Status: DC
  Administered 2022-12-30 – 2022-12-31 (×6): 3 mL via RESPIRATORY_TRACT
  Filled 2022-12-30 (×6): qty 3

## 2022-12-30 MED ORDER — FUROSEMIDE 10 MG/ML IJ SOLN
40.0000 mg | Freq: Once | INTRAMUSCULAR | Status: AC
  Administered 2022-12-30: 40 mg via INTRAVENOUS
  Filled 2022-12-30: qty 4

## 2022-12-30 NOTE — Progress Notes (Signed)
Progress Note   Patient: Jermaine Alvarez DOB: Jun 29, 1980 DOA: 12/28/2022     2 DOS: the patient was seen and examined on 12/30/2022   Brief hospital course: Taken from H&P and prior notes.   Jermaine Alvarez is a 42 y.o. male with medical history significant of polysubstance abuse, hypertension, GERD presenting with drug overdose, acute respiratory failure with hypoxia.  Per report, patient took excessive amounts of cocaine and fentanyl at home.  Patient was found unresponsive by his family.  Patient had?  Agonal breathing.  CPR was done by family at the bedside.  EMS subsequently called with patient be receiving nasal Narcan x 2.  There was some improvement in mentation.  Family also with concern of possible vomiting aspiration event associated with being found unresponsive.  Patient states that he was just trying to get high.  Mother reports longstanding history of substance abuse in the past.  Patient denies any alcohol or IV drug use.  Positive tobacco use.   When presented to ED patient has tachycardia and tachypnea, was on nonrebreather. Labs pertinent for significant leukocytosis at 19.1, toxicology screen was negative for salicylates, acetaminophen, ethanol.  UDS positive for cocaine, tricyclic and cannabinoid. Chest x-ray was without any significant abnormality CTA was negative for PE but did show rapid development of extensive coalescing nodular alveolar opacities throughout the both lungs, most consistent with acute edema/ARDS.  Aspiration can be a possibility but less likely due to the distribution.  Another differential of acute pulmonary hemorrhage per radiology report.  Patient was admitted in ICU.  Later placed on heated high flow.  7/22: Remained mildly tachycardic, improving leukocytosis.  Placed on Unasyn for concern of aspiration.  Able to wean back to regular nasal cannula from heated high flow.  Denies any suicidal idea.  IV fluid discontinued.  7/23:  Patient still on 2 to 4 L of oxygen.  Repeat chest x-ray with increasing bilateral opacities.  Giving 1 dose of IV Lasix and adding bronchodilator.    Assessment and Plan: * Drug overdose Patient found unresponsive at home with shallow breathing in the setting of cocaine and fentanyl use.  CPR was done by a family member Status post multiple rounds of Narcan Urine drug screen positive for tricyclic, cocaine and cannabinoid. Per patient no suicidal intention -Continue to monitor  Acute respiratory failure with hypoxia (HCC) New O2 requirement up to requiring wearing a nonrebreather at the bedside with concern for aspiration event associated with drug overdose, later he was placed on heated high flow.  CT chest with concern of bilateral opacities, pulmonary edema versus ARDS. PCCM is on board. -Oxygen requirement remained at 2 to 4 L.  Did receive IV fluid.  Repeat chest x-ray today with worsening bilateral opacities. -Give 1 dose of IV Lasix at 40 mg -Add bronchodilator -Continue to monitor -Continue with Unasyn for concern of aspiration with unresponsiveness  Hypertension BP stable On multiple medications at home, currently only on carvedilol and also received Lasix today due to initial softer blood pressure. -Continue to monitor-we will restart home medications as appropriate  Polysubstance abuse (HCC) Patient self admits to cocaine and fentanyl use Mother reports longstanding history of substance abuse in the past Noted drug overdose in the setting of cocaine and fentanyl use Urine drug screen pending Discussed cessation at length with the patient   Subjective: Patient was resting comfortably when seen today.  Continues to have mild shortness of breath.  Physical Exam: Vitals:   12/30/22 1100 12/30/22  1200 12/30/22 1213 12/30/22 1300  BP:  134/87    Pulse: (!) 117 (!) 115 (!) 117 (!) 107  Resp: (!) 28 18 (!) 21 (!) 27  Temp: 98.1 F (36.7 C) 98.1 F (36.7 C)    TempSrc:  Oral Oral    SpO2: 99% 100% 100% 90%  Weight:      Height:       General.  Well-developed gentleman, in no acute distress. Pulmonary.  Bilateral scattered wheeze with some intermittent crackles bilaterally, normal respiratory effort. CV.  Regular rate and rhythm, no JVD, rub or murmur. Abdomen.  Soft, nontender, nondistended, BS positive. CNS.  Alert and oriented .  No focal neurologic deficit. Extremities.  No edema, no cyanosis, pulses intact and symmetrical. Psychiatry.  Judgment and insight appears normal.   Data Reviewed: Prior data reviewed  Family Communication: Discussed with patient  Disposition: Status is: Inpatient Remains inpatient appropriate because: Severity of illness  Planned Discharge Destination: Home  DVT prophylaxis.  Lovenox Time spent: 47 minutes  This record has been created using Conservation officer, historic buildings. Errors have been sought and corrected,but may not always be located. Such creation errors do not reflect on the standard of care.   Author: Arnetha Courser, MD 12/30/2022 3:38 PM  For on call review [email or web address].

## 2022-12-30 NOTE — Assessment & Plan Note (Signed)
BP stable On multiple medications at home, currently only on carvedilol and also received Lasix today due to initial softer blood pressure. -Continue to monitor-we will restart home medications as appropriate

## 2022-12-30 NOTE — Assessment & Plan Note (Signed)
Patient found unresponsive at home with shallow breathing in the setting of cocaine and fentanyl use.  CPR was done by a family member Status post multiple rounds of Narcan Urine drug screen positive for tricyclic, cocaine and cannabinoid. Per patient no suicidal intention -Continue to monitor

## 2022-12-30 NOTE — TOC Progression Note (Signed)
Transition of Care [facility]) - Progression Note    Patient Details  Name: Jermaine Alvarez MRN: [id number] Date of Birth: 01-13-1981  Transition of [facility]) CM/SW Contact  Kreg Shropshire, RN Phone Number: 12/30/2022, 12:11 PM  Clinical Narrative:     Cm received message from Steva Colder, RN regarding calling CPS due to pt 40 year old son performing CPR on pt. Cm called [facility] CPS and spoke with rep regarding child. CPS received the information and will f/u.     Barriers to Discharge: Continued Medical Work up  Expected Discharge Plan and Services                                               Social Determinants of Health (SDOH) Interventions SDOH Screenings   Tobacco Use: High Risk (12/28/2022)    Readmission Risk Interventions     No data to display

## 2022-12-30 NOTE — Assessment & Plan Note (Signed)
New O2 requirement up to requiring wearing a nonrebreather at the bedside with concern for aspiration event associated with drug overdose, later he was placed on heated high flow.  CT chest with concern of bilateral opacities, pulmonary edema versus ARDS. PCCM is on board. -Oxygen requirement remained at 2 to 4 L.  Did receive IV fluid.  Repeat chest x-ray today with worsening bilateral opacities. -Give 1 dose of IV Lasix at 40 mg -Add bronchodilator -Continue to monitor -Continue with Unasyn for concern of aspiration with unresponsiveness

## 2022-12-30 NOTE — Plan of Care (Signed)
  Problem: Education: Goal: Knowledge of General Education information will improve Description: Including pain rating scale, medication(s)/side effects and non-pharmacologic comfort measures Outcome: Progressing   Problem: Health Behavior/Discharge Planning: Goal: Ability to manage health-related needs will improve Outcome: Progressing   Problem: Clinical Measurements: Goal: Ability to maintain clinical measurements within normal limits will improve Outcome: Progressing Goal: Will remain free from infection Outcome: Progressing Goal: Diagnostic test results will improve Outcome: Progressing Goal: Respiratory complications will improve Outcome: Progressing Goal: Cardiovascular complication will be avoided Outcome: Progressing   Problem: Activity: Goal: Risk for activity intolerance will decrease Outcome: Progressing   Problem: Nutrition: Goal: Adequate nutrition will be maintained Outcome: Progressing   Problem: Elimination: Goal: Will not experience complications related to bowel motility Outcome: Progressing Goal: Will not experience complications related to urinary retention Outcome: Progressing   Problem: Pain Managment: Goal: General experience of comfort will improve Outcome: Progressing   Problem: Safety: Goal: Ability to remain free from injury will improve Outcome: Progressing   Problem: Skin Integrity: Goal: Risk for impaired skin integrity will decrease Outcome: Progressing   Problem: Coping: Goal: Ability to adjust to condition or change in health will improve Outcome: Progressing   Problem: Fluid Volume: Goal: Ability to maintain a balanced intake and output will improve Outcome: Progressing   Problem: Health Behavior/Discharge Planning: Goal: Ability to identify and utilize available resources and services will improve Outcome: Progressing Goal: Ability to manage health-related needs will improve Outcome: Progressing   Problem:  Metabolic: Goal: Ability to maintain appropriate glucose levels will improve Outcome: Progressing   Problem: Nutritional: Goal: Maintenance of adequate nutrition will improve Outcome: Progressing Goal: Progress toward achieving an optimal weight will improve Outcome: Progressing   Problem: Skin Integrity: Goal: Risk for impaired skin integrity will decrease Outcome: Progressing

## 2022-12-31 DIAGNOSIS — T17908A Unspecified foreign body in respiratory tract, part unspecified causing other injury, initial encounter: Secondary | ICD-10-CM

## 2022-12-31 DIAGNOSIS — T50901A Poisoning by unspecified drugs, medicaments and biological substances, accidental (unintentional), initial encounter: Secondary | ICD-10-CM

## 2022-12-31 DIAGNOSIS — R0789 Other chest pain: Secondary | ICD-10-CM

## 2022-12-31 DIAGNOSIS — R0902 Hypoxemia: Secondary | ICD-10-CM

## 2022-12-31 LAB — BASIC METABOLIC PANEL
Anion gap: 6 (ref 5–15)
BUN: 32 mg/dL — ABNORMAL HIGH (ref 6–20)
CO2: 28 mmol/L (ref 22–32)
Calcium: 8.7 mg/dL — ABNORMAL LOW (ref 8.9–10.3)
Chloride: 103 mmol/L (ref 98–111)
Creatinine, Ser: 0.97 mg/dL (ref 0.61–1.24)
GFR, Estimated: >60 mL/min (ref >60)
Glucose, Bld: 126 mg/dL — ABNORMAL HIGH (ref 70–99)
Potassium: 4.2 mmol/L (ref 3.5–5.1)
Sodium: 137 mmol/L (ref 135–145)

## 2022-12-31 LAB — CBC
HCT: 37.6 % — ABNORMAL LOW (ref 39.0–52.0)
Hemoglobin: 12.8 g/dL — ABNORMAL LOW (ref 13.0–17.0)
MCH: 29.4 pg (ref 26.0–34.0)
MCHC: 34 g/dL (ref 30.0–36.0)
MCV: 86.4 fL (ref 80.0–100.0)
Platelets: 264 10*3/uL (ref 150–400)
RBC: 4.35 MIL/uL (ref 4.22–5.81)
RDW: 12.1 % (ref 11.5–15.5)
WBC: 19.5 10*3/uL — ABNORMAL HIGH (ref 4.0–10.5)
nRBC: 0 % (ref 0.0–0.2)

## 2022-12-31 LAB — PROCALCITONIN: Procalcitonin: 1.65 ng/mL

## 2022-12-31 MED ORDER — NAPROXEN 500 MG PO TABS: 500.0000 mg | ORAL_TABLET | Freq: Two times a day (BID) | ORAL | 0 refills | Status: AC | PRN

## 2022-12-31 MED ORDER — AZITHROMYCIN 250 MG PO TABS: ORAL_TABLET | ORAL | 0 refills | Status: AC

## 2022-12-31 MED ORDER — CARVEDILOL 6.25 MG PO TABS: 6.2500 mg | ORAL_TABLET | Freq: Every day | ORAL | Status: DC

## 2022-12-31 MED ORDER — PANTOPRAZOLE SODIUM 40 MG PO TBEC
40.0000 mg | DELAYED_RELEASE_TABLET | Freq: Every day | ORAL | Status: DC
Start: 2022-12-31 — End: 2022-12-31

## 2022-12-31 MED ORDER — IPRATROPIUM-ALBUTEROL 0.5-2.5 (3) MG/3ML IN SOLN
3.0000 mL | Freq: Three times a day (TID) | RESPIRATORY_TRACT | Status: DC
  Administered 2022-12-31: 3 mL via RESPIRATORY_TRACT
  Filled 2022-12-31: qty 3

## 2022-12-31 MED ORDER — VENLAFAXINE HCL 37.5 MG PO TABS
75.0000 mg | ORAL_TABLET | Freq: Every day | ORAL | Status: DC
  Administered 2022-12-31: 75 mg via ORAL
  Filled 2022-12-31: qty 2

## 2022-12-31 MED ORDER — ALBUTEROL SULFATE HFA 108 (90 BASE) MCG/ACT IN AERS: 2.0000 | INHALATION_SPRAY | Freq: Four times a day (QID) | RESPIRATORY_TRACT | 2 refills | Status: AC | PRN

## 2022-12-31 MED ORDER — AMOXICILLIN-POT CLAVULANATE 875-125 MG PO TABS: 1.0000 | ORAL_TABLET | Freq: Two times a day (BID) | ORAL | 0 refills | Status: AC

## 2022-12-31 MED ORDER — LISINOPRIL 5 MG PO TABS
20.0000 mg | ORAL_TABLET | Freq: Every day | ORAL | Status: DC
  Administered 2022-12-31: 20 mg via ORAL
  Filled 2022-12-31: qty 4

## 2022-12-31 MED ORDER — PREDNISONE 20 MG PO TABS: 40.0000 mg | ORAL_TABLET | Freq: Every day | ORAL | 0 refills | Status: AC

## 2022-12-31 MED ORDER — ESCITALOPRAM OXALATE 20 MG PO TABS: 20.0000 mg | ORAL_TABLET | Freq: Every day | ORAL | Status: DC

## 2022-12-31 MED ORDER — COMBIVENT RESPIMAT 20-100 MCG/ACT IN AERS: 1.0000 | INHALATION_SPRAY | Freq: Four times a day (QID) | RESPIRATORY_TRACT | 1 refills | Status: DC | PRN

## 2022-12-31 MED ORDER — PREDNISONE 10 MG PO TABS: 40.0000 mg | ORAL_TABLET | Freq: Every day | ORAL | Status: DC

## 2022-12-31 MED ORDER — FUROSEMIDE 10 MG/ML IJ SOLN
40.0000 mg | Freq: Once | INTRAMUSCULAR | Status: AC
  Administered 2022-12-31: 40 mg via INTRAVENOUS
  Filled 2022-12-31: qty 4

## 2022-12-31 MED ORDER — GUAIFENESIN-DM 100-10 MG/5ML PO SYRP: 5.0000 mL | ORAL_SOLUTION | ORAL | 0 refills | Status: AC | PRN

## 2022-12-31 MED ORDER — AZITHROMYCIN 500 MG PO TABS
500.0000 mg | ORAL_TABLET | Freq: Every day | ORAL | Status: AC
  Administered 2022-12-31: 500 mg via ORAL
  Filled 2022-12-31: qty 1

## 2022-12-31 MED ORDER — AZITHROMYCIN 250 MG PO TABS: 250.0000 mg | ORAL_TABLET | Freq: Every day | ORAL | Status: DC

## 2022-12-31 NOTE — Discharge Summary (Signed)
Physician Discharge Summary   Patient: Jermaine Alvarez MRN: [id number] DOB: 1981/05/21  Admit date:     12/28/2022  Discharge date: 12/31/22  Discharge Physician: Arnetha Courser   PCP: Jerl Mina, MD   Recommendations at discharge:  Please obtain CBC and BMP in 1 week Follow-up with primary care provider. Follow-up with pulmonology Patient will need serial chest imaging to see the resolution of abnormal findings.  Discharge Diagnoses: Principal Problem:   Drug overdose Active Problems:   Acute respiratory failure with hypoxia (HCC)   Hypertension   Polysubstance abuse (HCC)   Aspiration into airway   Chest wall pain   Hypoxemia   Hospital Course: Taken from H&P and prior notes.   Jermaine Alvarez is a 42 y.o. male with medical history significant of polysubstance abuse, hypertension, GERD presenting with drug overdose, acute respiratory failure with hypoxia.  Per report, patient took excessive amounts of cocaine and fentanyl at home.  Patient was found unresponsive by his family.  Patient had?  Agonal breathing.  CPR was done by family at the bedside.  EMS subsequently called with patient be receiving nasal Narcan x 2.  There was some improvement in mentation.  Family also with concern of possible vomiting aspiration event associated with being found unresponsive.  Patient states that he was just trying to get high.  Mother reports longstanding history of substance abuse in the past.  Patient denies any alcohol or IV drug use.  Positive tobacco use.   When presented to ED patient has tachycardia and tachypnea, was on nonrebreather. Labs pertinent for significant leukocytosis at 19.1, toxicology screen was negative for salicylates, acetaminophen, ethanol.  UDS positive for cocaine, tricyclic and cannabinoid. Chest x-ray was without any significant abnormality CTA was negative for PE but did show rapid development of extensive coalescing nodular alveolar opacities throughout  the both lungs, most consistent with acute edema/ARDS.  Aspiration can be a possibility but less likely due to the distribution.  Another differential of acute pulmonary hemorrhage per radiology report.  Patient was admitted in ICU.  Later placed on heated high flow.  7/22: Remained mildly tachycardic, improving leukocytosis.  Placed on Unasyn for concern of aspiration.  Able to wean back to regular nasal cannula from heated high flow.  Denies any suicidal idea.  IV fluid discontinued.  7/23: Patient still on 2 to 4 L of oxygen.  Repeat chest x-ray with increasing bilateral opacities.  Giving 1 dose of IV Lasix and adding bronchodilator.  7/23: Hemodynamically stable.  Worsening leukocytosis but patient was on Solu-Medrol.  Restarting home lisinopril, Lexapro and Effexor.  Patient was also started on Zithromax for atypical coverage.  Discussed with pulmonology and they will follow him up as outpatient to see the resolution of findings from chest imaging.  Patient is being discharged on 4 more days of Augmentin, Zithromax and prednisone.  He was also given Combivent inhaler to use as needed for wheezing.  Patient received 2 doses of IV Lasix during current hospitalization.  Patient will continue his home medications on discharge and need to have a close follow-up with his providers for further recommendations.  Assessment and Plan: * Drug overdose Patient found unresponsive at home with shallow breathing in the setting of cocaine and fentanyl use.  CPR was done by a family member Status post multiple rounds of Narcan Urine drug screen positive for tricyclic, cocaine and cannabinoid. Per patient no suicidal intention -Continue to monitor  Acute respiratory failure with hypoxia (HCC) New O2 requirement  up to requiring wearing a nonrebreather at the bedside with concern for aspiration event associated with drug overdose, later he was placed on heated high flow.  CT chest with concern of bilateral  opacities, pulmonary edema versus ARDS. PCCM is on board. -Oxygen requirement remained at 2 to 4 L.  Did receive IV fluid.  Repeat chest x-ray today with worsening bilateral opacities. -Give 1 dose of IV Lasix at 40 mg -Add bronchodilator -Continue to monitor -Continue with Unasyn for concern of aspiration with unresponsiveness  Hypertension BP stable On multiple medications at home, currently only on carvedilol and also received Lasix today due to initial softer blood pressure. -Continue to monitor-we will restart home medications as appropriate  Polysubstance abuse (HCC) Patient self admits to cocaine and fentanyl use Mother reports longstanding history of substance abuse in the past Noted drug overdose in the setting of cocaine and fentanyl use Urine drug screen pending Discussed cessation at length with the patient   Consultants: PCCM Procedures performed: None Disposition: Home Diet recommendation:  Discharge Diet Orders (From admission, onward)     Start     Ordered   12/31/22 0000  Diet - low sodium heart healthy        12/31/22 1244           Cardiac diet DISCHARGE MEDICATION: Allergies as of 12/31/2022   No Known Allergies      Medication List     STOP taking these medications    amLODipine 5 MG tablet Commonly known as: NORVASC   omeprazole 20 MG capsule Commonly known as: PRILOSEC       TAKE these medications    amoxicillin-clavulanate 875-125 MG tablet Commonly known as: AUGMENTIN Take 1 tablet by mouth 2 (two) times daily for 4 days.   azithromycin 250 MG tablet Commonly known as: ZITHROMAX 1 tablet daily for next 4 days Start taking on: January 01, 2023   carvedilol 6.25 MG tablet Commonly known as: COREG Take 6.25 mg by mouth daily at 6 (six) AM.   cetirizine 10 MG tablet Commonly known as: ZYRTEC Take 10 mg by mouth daily.   cloNIDine 0.1 MG tablet Commonly known as: CATAPRES Take 0.1 mg by mouth daily.   Combivent Respimat  20-100 MCG/ACT Aers respimat Generic drug: Ipratropium-Albuterol Inhale 1 puff into the lungs every 6 (six) hours as needed for wheezing or shortness of breath.   cyclobenzaprine 10 MG tablet Commonly known as: FLEXERIL Take 10 mg by mouth 3 (three) times daily as needed for muscle spasms. Has not needed in past week   escitalopram 20 MG tablet Commonly known as: LEXAPRO Take by mouth.   esomeprazole 40 MG capsule Commonly known as: NEXIUM Take 40 mg by mouth daily at 12 noon.   fluticasone 50 MCG/ACT nasal spray Commonly known as: FLONASE Place 1 spray into both nostrils daily.   guaiFENesin-dextromethorphan 100-10 MG/5ML syrup Commonly known as: ROBITUSSIN DM Take 5 mLs by mouth every 4 (four) hours as needed for cough.   lisinopril 20 MG tablet Commonly known as: ZESTRIL Take by mouth.   naproxen 500 MG tablet Commonly known as: Naprosyn Take 1 tablet (500 mg total) by mouth 2 (two) times daily as needed for moderate pain, mild pain or headache. Take with meals   predniSONE 20 MG tablet Commonly known as: DELTASONE Take 2 tablets (40 mg total) by mouth daily with breakfast for 4 days. Start taking on: January 01, 2023   venlafaxine 37.5 MG tablet Commonly known as: [facility]  Take 75 mg by mouth daily at 6 (six) AM. Takes 2 tablets of 37.5 mg        Follow-up Information     Jerl Mina, MD. Schedule an appointment as soon as possible for a visit in 1 week(s).   Specialty: Family Medicine Contact information: [address] [facility] [place] [address] [phone]         Vida Rigger, MD. Schedule an appointment as soon as possible for a visit.   Specialty: Pulmonary Disease Contact information: [address] [place] [address] [phone]                Discharge Exam: Ceasar Mons Weights   12/28/22 1050  Weight: 88.5 kg   General.  Well-developed gentleman, in no acute distress. Pulmonary.  Lungs clear  bilaterally, normal respiratory effort. CV.  Regular rate and rhythm, no JVD, rub or murmur. Abdomen.  Soft, nontender, nondistended, BS positive. CNS.  Alert and oriented .  No focal neurologic deficit. Extremities.  No edema, no cyanosis, pulses intact and symmetrical. Psychiatry.  Judgment and insight appears normal.   Condition at discharge: stable  The results of significant diagnostics from this hospitalization (including imaging, microbiology, ancillary and laboratory) are listed below for reference.   Imaging Studies: DG Chest Port 1 View  Result Date: 12/30/2022 CLINICAL DATA:  Shortness of breath. EXAM: PORTABLE CHEST 1 VIEW COMPARISON:  December 28, 2022. FINDINGS: The heart size and mediastinal contours are within normal limits. Significantly increased bilateral nodular airspace opacities are noted diffusely suggesting edema, infection or possibly hemorrhage. The visualized skeletal structures are unremarkable. IMPRESSION: Significantly increased bilateral lung opacities as noted above. Electronically Signed   By: Lupita Raider M.D.   On: 12/30/2022 13:09   CT Angio Chest Pulmonary Embolism (PE) W or WO Contrast  Result Date: 12/28/2022 CLINICAL DATA:  Found down. Post CPR. Evaluate for acute pulmonary embolism. EXAM: CT ANGIOGRAPHY CHEST WITH CONTRAST TECHNIQUE: Multidetector CT imaging of the chest was performed using the standard protocol during bolus administration of intravenous contrast. Multiplanar CT image reconstructions and MIPs were obtained to evaluate the vascular anatomy. RADIATION DOSE REDUCTION: This exam was performed according to the departmental dose-optimization program which includes automated exposure control, adjustment of the mA and/or kV according to patient size and/or use of iterative reconstruction technique. CONTRAST:  75mL OMNIPAQUE IOHEXOL 350 MG/ML SOLN COMPARISON:  Chest radiographs earlier the same date and 04/03/2020 FINDINGS: Cardiovascular: The  pulmonary arteries are well opacified with contrast to the level of the subsegmental branches. There is no evidence of acute pulmonary embolism. The systemic arteries appear normal. The heart size is normal. There is no pericardial effusion. Mediastinum/Nodes: Prominent mediastinal and hilar lymph nodes, including an AP window node measuring 10 mm on image 56/4 and an 8 mm subcarinal node on image 68/4. No axillary adenopathy. The thyroid gland, trachea and esophagus demonstrate no significant findings. Lungs/Pleura: There is no pleural effusion or pneumothorax. Extensive coalescing nodular alveolar opacities have developed throughout both lungs during the less than 2 hour interval from the prior chest radiographs. These have a perihilar distribution, not typical for aspiration. Upper abdomen:  The visualized upper abdomen appears unremarkable. Musculoskeletal/Chest wall: No acute fractures are identified. There are mild degenerative changes in the spine. Bilateral gynecomastia noted. Unless specific follow-up recommendations are mentioned in the findings or impression sections, no imaging follow-up of any mentioned incidental findings is recommended. Review of the MIP images confirms the above findings. IMPRESSION: 1.  No evidence of acute pulmonary embolism or other acute vascular findings in the chest. 2. Rapid development of extensive coalescing nodular alveolar opacities throughout both lungs since earlier chest radiographs, most consistent with acute edema/ARDS. This could be secondary to aspiration, although the distribution is atypical. Acute pulmonary hemorrhage could have this appearance. Rapid development not typical for infection. 3. Prominent mediastinal and hilar lymph nodes, likely reactive. 4. Bilateral gynecomastia. Electronically Signed   By: Carey Bullocks M.D.   On: 12/28/2022 12:48   DG Chest Port 1 View  Result Date: 12/28/2022 CLINICAL DATA:  Narcan reversible, cough vomiting and hypoxia  concern for aspiration. EXAM: PORTABLE CHEST 1 VIEW COMPARISON:  None Available. FINDINGS: The heart size and mediastinal contours are within normal limits. Both lungs are clear. The visualized skeletal structures are unremarkable. IMPRESSION: No active disease. Electronically Signed   By: Larose Hires D.O.   On: 12/28/2022 11:15    Microbiology: Results for orders placed or performed during the hospital encounter of 12/28/22  SARS Coronavirus 2 by RT PCR (hospital order, performed in [facility] hospital lab) *cepheid single result test* Anterior Nasal Swab     Status: None   Collection Time: 12/28/22  4:15 PM   Specimen: Anterior Nasal Swab  Result Value Ref Range Status   SARS Coronavirus 2 by RT PCR NEGATIVE NEGATIVE Final    Comment: (NOTE) SARS-CoV-2 target nucleic acids are NOT DETECTED.  The SARS-CoV-2 RNA is generally detectable in upper and lower respiratory specimens during the acute phase of infection. The lowest concentration of SARS-CoV-2 viral copies this assay can detect is 250 copies / mL. A negative result does not preclude SARS-CoV-2 infection and should not be used as the sole basis for treatment or other patient management decisions.  A negative result may occur with improper specimen collection / handling, submission of specimen other than nasopharyngeal swab, presence of viral mutation(s) within the areas targeted by this assay, and inadequate number of viral copies (<250 copies / mL). A negative result must be combined with clinical observations, patient history, and epidemiological information.  Fact Sheet for Patients:   RoadLapTop.co.za  Fact Sheet for Healthcare Providers: http://kim-miller.com/  This test is not yet approved or  cleared by the [place] FDA and has been authorized for detection and/or diagnosis of SARS-CoV-2 by FDA under an Emergency Use Authorization (EUA).  This EUA will remain in effect  (meaning this test can be used) for the duration of the COVID-19 declaration under Section 564(b)(1) of the Act, 21 U.S.C. section 360bbb-3(b)(1), unless the authorization is terminated or revoked sooner.  Performed at [facility], [address] [place], [place] [address]   Respiratory (~20 pathogens) panel by PCR     Status: None   Collection Time: 12/28/22  4:15 PM   Specimen: Anterior Nasal Swab; Respiratory  Result Value Ref Range Status   Adenovirus NOT DETECTED NOT DETECTED Final   Coronavirus 229E NOT DETECTED NOT DETECTED Final    Comment: (NOTE) The Coronavirus on the Respiratory Panel, DOES NOT test for the novel  Coronavirus (2019 nCoV)    Coronavirus HKU1 NOT DETECTED NOT DETECTED Final   Coronavirus NL63 NOT DETECTED NOT DETECTED Final   Coronavirus OC43 NOT DETECTED NOT DETECTED Final   Metapneumovirus NOT DETECTED NOT DETECTED Final   Rhinovirus / Enterovirus NOT DETECTED NOT DETECTED Final   Influenza A NOT DETECTED NOT DETECTED Final   Influenza B NOT DETECTED NOT DETECTED Final   Parainfluenza Virus 1 NOT  DETECTED NOT DETECTED Final   Parainfluenza Virus 2 NOT DETECTED NOT DETECTED Final   Parainfluenza Virus 3 NOT DETECTED NOT DETECTED Final   Parainfluenza Virus 4 NOT DETECTED NOT DETECTED Final   Respiratory Syncytial Virus NOT DETECTED NOT DETECTED Final   Bordetella pertussis NOT DETECTED NOT DETECTED Final   Bordetella Parapertussis NOT DETECTED NOT DETECTED Final   Chlamydophila pneumoniae NOT DETECTED NOT DETECTED Final   Mycoplasma pneumoniae NOT DETECTED NOT DETECTED Final    Comment: Performed at [facility], [address], [place], [place] [address]  MRSA Next Gen by PCR, Nasal     Status: None   Collection Time: 12/28/22  4:30 PM   Specimen: Nasal Mucosa; Nasal Swab  Result Value Ref Range Status   MRSA by PCR Next Gen NOT DETECTED NOT DETECTED Final    Comment: (NOTE) The GeneXpert MRSA Assay (FDA approved for NASAL  specimens only), is one component of a comprehensive MRSA colonization surveillance program. It is not intended to diagnose MRSA infection nor to guide or monitor treatment for MRSA infections. Test performance is not FDA approved in patients less than 6 years old. Performed at [facility], [address] [place], [place] [address]     Labs: CBC: Recent Labs  Lab 12/28/22 1054 12/29/22 0415 12/30/22 0454 12/31/22 0413  WBC 19.1* 16.7* 17.9* 19.5*  NEUTROABS 15.9*  --   --   --   HGB 15.4 12.6* 12.6* 12.8*  HCT 44.9 37.3* 36.9* 37.6*  MCV 86.8 86.9 87.9 86.4  PLT 277 228 233 264   Basic Metabolic Panel: Recent Labs  Lab 12/28/22 1054 12/29/22 0415 12/29/22 1230 12/30/22 0454 12/31/22 0413  NA 134* 136 137 136 137  K 3.7 5.0 4.1 4.8 4.2  CL 99 104 101 103 103  CO2 24 24 28 26 28   GLUCOSE 172* 118* 147* 148* 126*  BUN 18 17 18  26* 32*  CREATININE 1.20 0.77 0.82 0.88 0.97  CALCIUM 10.3 8.8* 9.0 9.2 8.7*   Liver Function Tests: Recent Labs  Lab 12/28/22 1054 12/29/22 0415  AST 38 40  ALT 34 28  ALKPHOS 78 46  BILITOT 0.8 1.0  PROT 7.8 6.6  ALBUMIN 4.1 3.5   CBG: Recent Labs  Lab 12/28/22 1637 12/28/22 2013 12/28/22 2339 12/29/22 0300 12/29/22 0731  GLUCAP 87 108* 91 117* 108*    Discharge time spent: greater than 30 minutes.  This record has been created using Conservation officer, historic buildings. Errors have been sought and corrected,but may not always be located. Such creation errors do not reflect on the standard of care.   Signed: Arnetha Courser, MD [facility] 12/31/2022

## 2022-12-31 NOTE — Plan of Care (Signed)

## 2023-09-21 ENCOUNTER — Ambulatory Visit

## 2023-10-22 ENCOUNTER — Ambulatory Visit
Admission: RE | Admit: 2023-10-22 | Discharge: 2023-10-22 | Disposition: A | Discharge status: Home / Self Care | Payer: MEDICAID | Source: Ambulatory Visit | Attending: Physical Medicine & Rehabilitation | Admitting: Physical Medicine & Rehabilitation

## 2023-10-22 DIAGNOSIS — M5442 Lumbago with sciatica, left side: Secondary | ICD-10-CM | POA: Diagnosis present

## 2023-10-22 DIAGNOSIS — G8929 Other chronic pain: Secondary | ICD-10-CM | POA: Diagnosis present

## 2024-07-08 ENCOUNTER — Other Ambulatory Visit: Payer: Self-pay | Admitting: Physical Medicine & Rehabilitation

## 2024-07-08 DIAGNOSIS — M5416 Radiculopathy, lumbar region: Secondary | ICD-10-CM

## 2024-07-08 DIAGNOSIS — G8929 Other chronic pain: Secondary | ICD-10-CM

## 2024-07-12 ENCOUNTER — Encounter: Payer: Self-pay | Admitting: Physical Medicine & Rehabilitation

## 2024-07-19 ENCOUNTER — Other Ambulatory Visit: Payer: MEDICAID
